# Patient Record
Sex: Male | Born: 1985 | Race: Black or African American | Hispanic: No | Marital: Single | Smoking: Never smoker
Health system: Southern US, Community
[De-identification: ages and names within clinical notes are randomized; demographics above are authoritative.]

## PROBLEM LIST (undated history)

## (undated) DIAGNOSIS — I10 Essential (primary) hypertension: Secondary | ICD-10-CM

## (undated) DIAGNOSIS — G935 Compression of brain: Secondary | ICD-10-CM

## (undated) DIAGNOSIS — K219 Gastro-esophageal reflux disease without esophagitis: Secondary | ICD-10-CM

## (undated) DIAGNOSIS — Z87442 Personal history of urinary calculi: Secondary | ICD-10-CM

## (undated) HISTORY — PX: NO PAST SURGERIES: SHX2092

---

## 2015-04-20 ENCOUNTER — Emergency Department: Payer: Self-pay

## 2015-04-20 ENCOUNTER — Encounter: Payer: Self-pay | Admitting: Emergency Medicine

## 2015-04-20 ENCOUNTER — Emergency Department
Admission: EM | Admit: 2015-04-20 | Discharge: 2015-04-20 | Disposition: A | Discharge status: Home / Self Care | Payer: Self-pay | Attending: Emergency Medicine | Admitting: Emergency Medicine

## 2015-04-20 DIAGNOSIS — X58XXXA Exposure to other specified factors, initial encounter: Secondary | ICD-10-CM | POA: Insufficient documentation

## 2015-04-20 DIAGNOSIS — Y990 Civilian activity done for income or pay: Secondary | ICD-10-CM | POA: Insufficient documentation

## 2015-04-20 DIAGNOSIS — Y9389 Activity, other specified: Secondary | ICD-10-CM | POA: Insufficient documentation

## 2015-04-20 DIAGNOSIS — S46912A Strain of unspecified muscle, fascia and tendon at shoulder and upper arm level, left arm, initial encounter: Secondary | ICD-10-CM | POA: Insufficient documentation

## 2015-04-20 DIAGNOSIS — Y9289 Other specified places as the place of occurrence of the external cause: Secondary | ICD-10-CM | POA: Insufficient documentation

## 2015-04-20 IMAGING — CR DG SHOULDER 2+V*L*
3 series · 3 of 3 positions shown · non-contrast
Comparison: None.

CLINICAL DATA: Patient began having posterior left shoulder pain
this morning after leaving work. Patient states that he has had left
arm pain and numbness for 3-4 years with no injury.

EXAM:
LEFT SHOULDER - 2+ VIEW

[shoulder grashey]
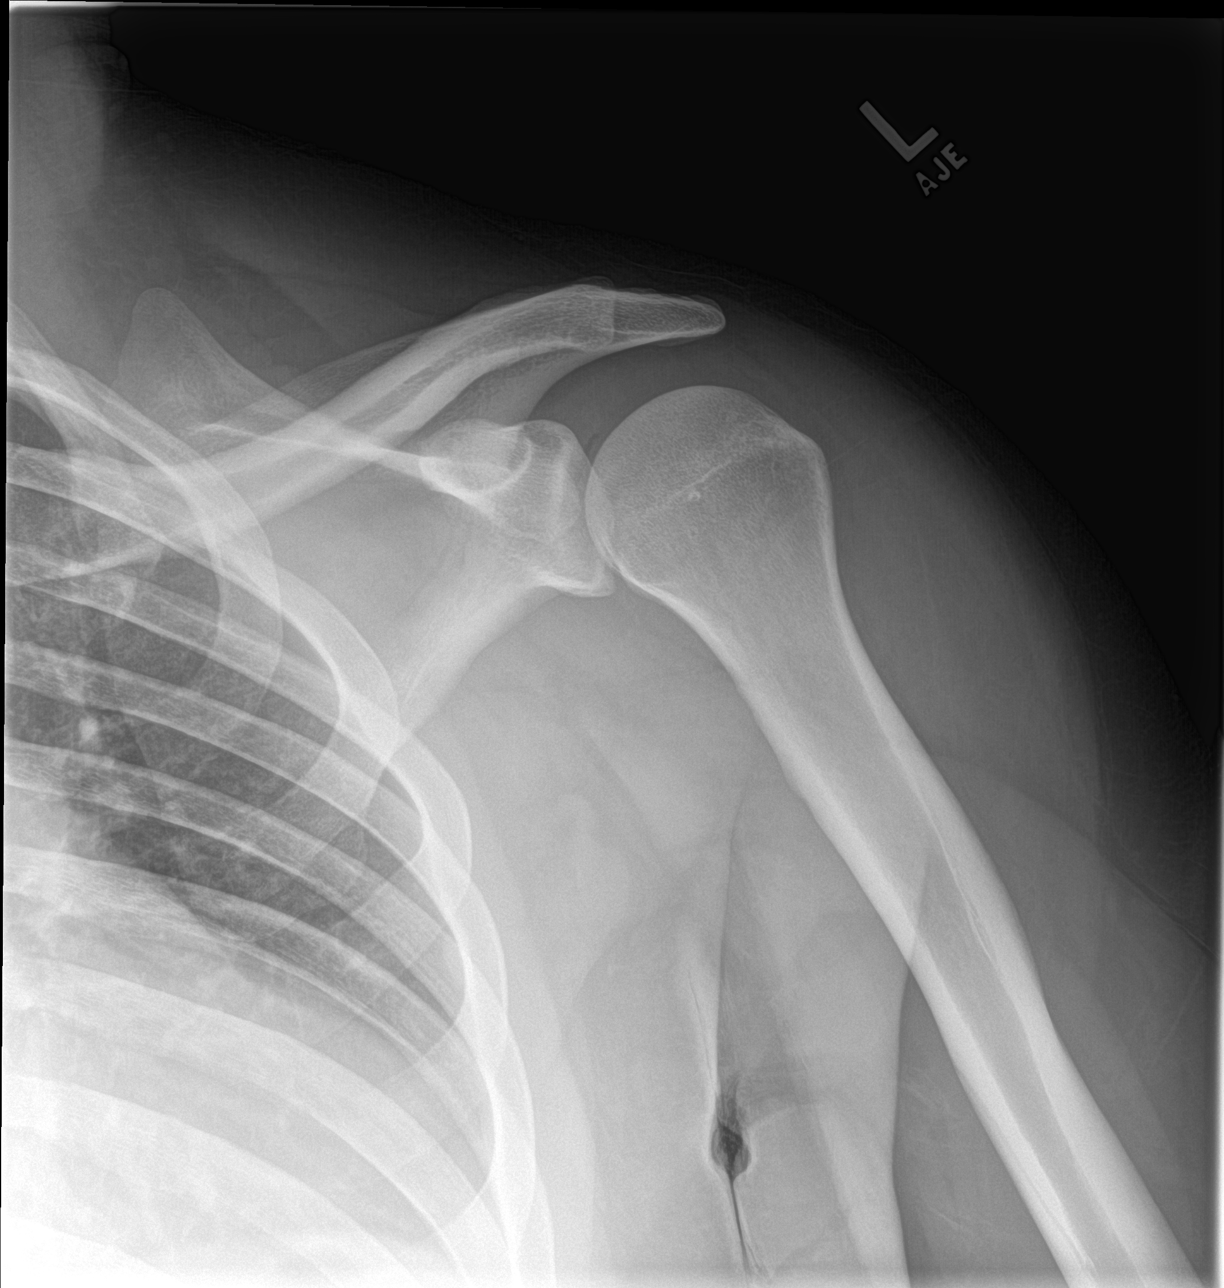

[shoulder y view]
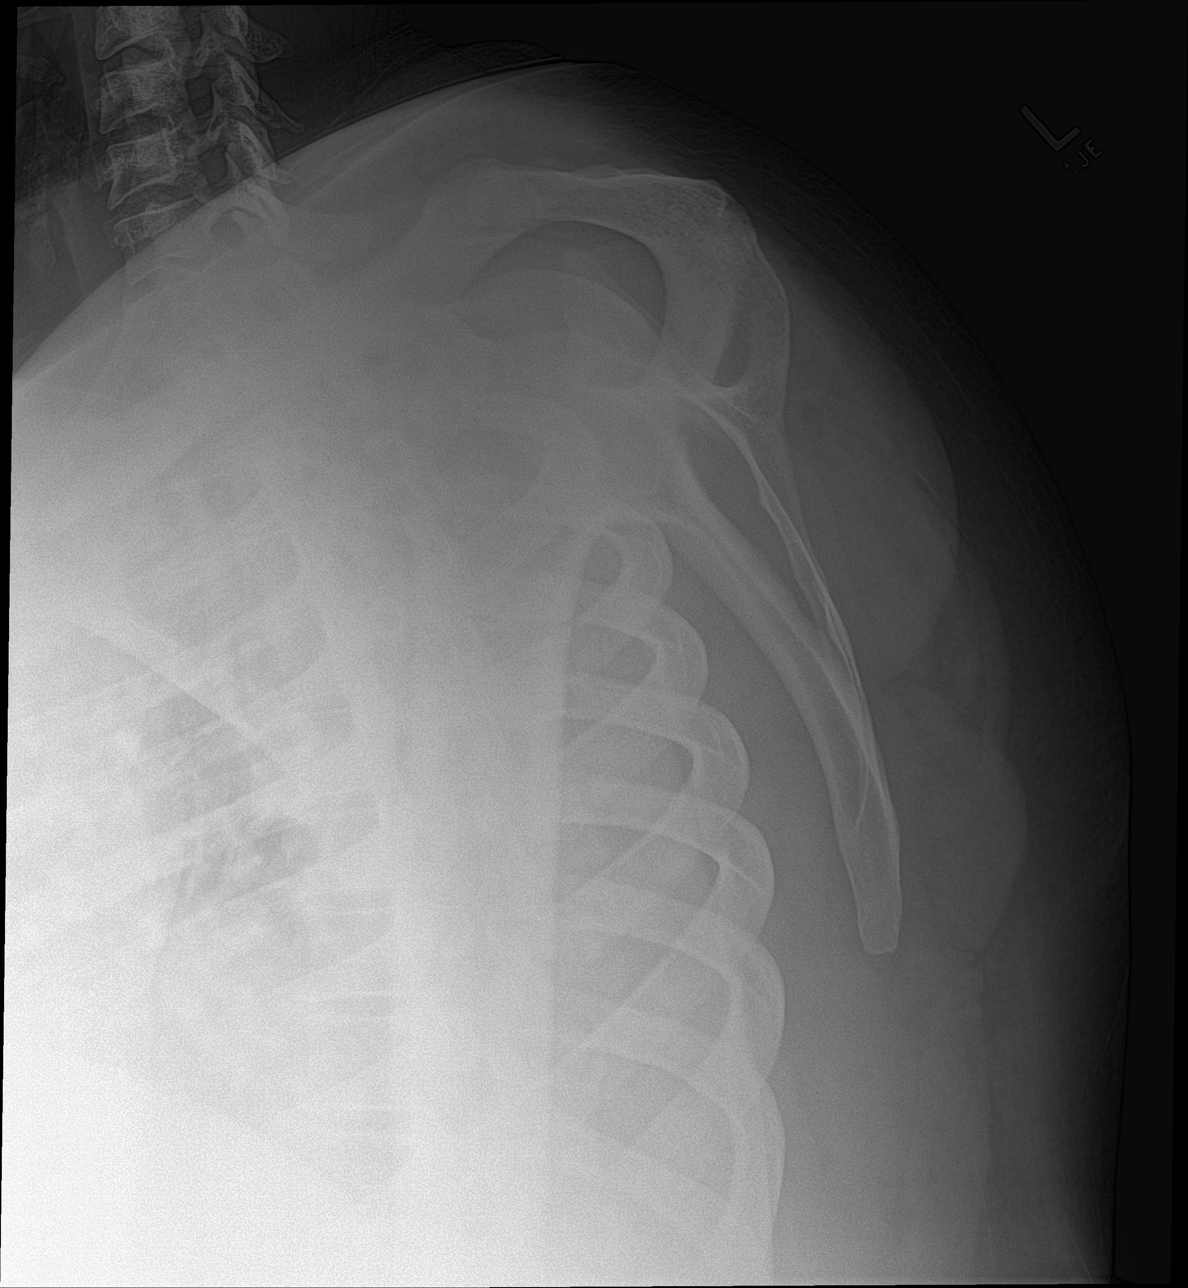

[shoulder axillary]
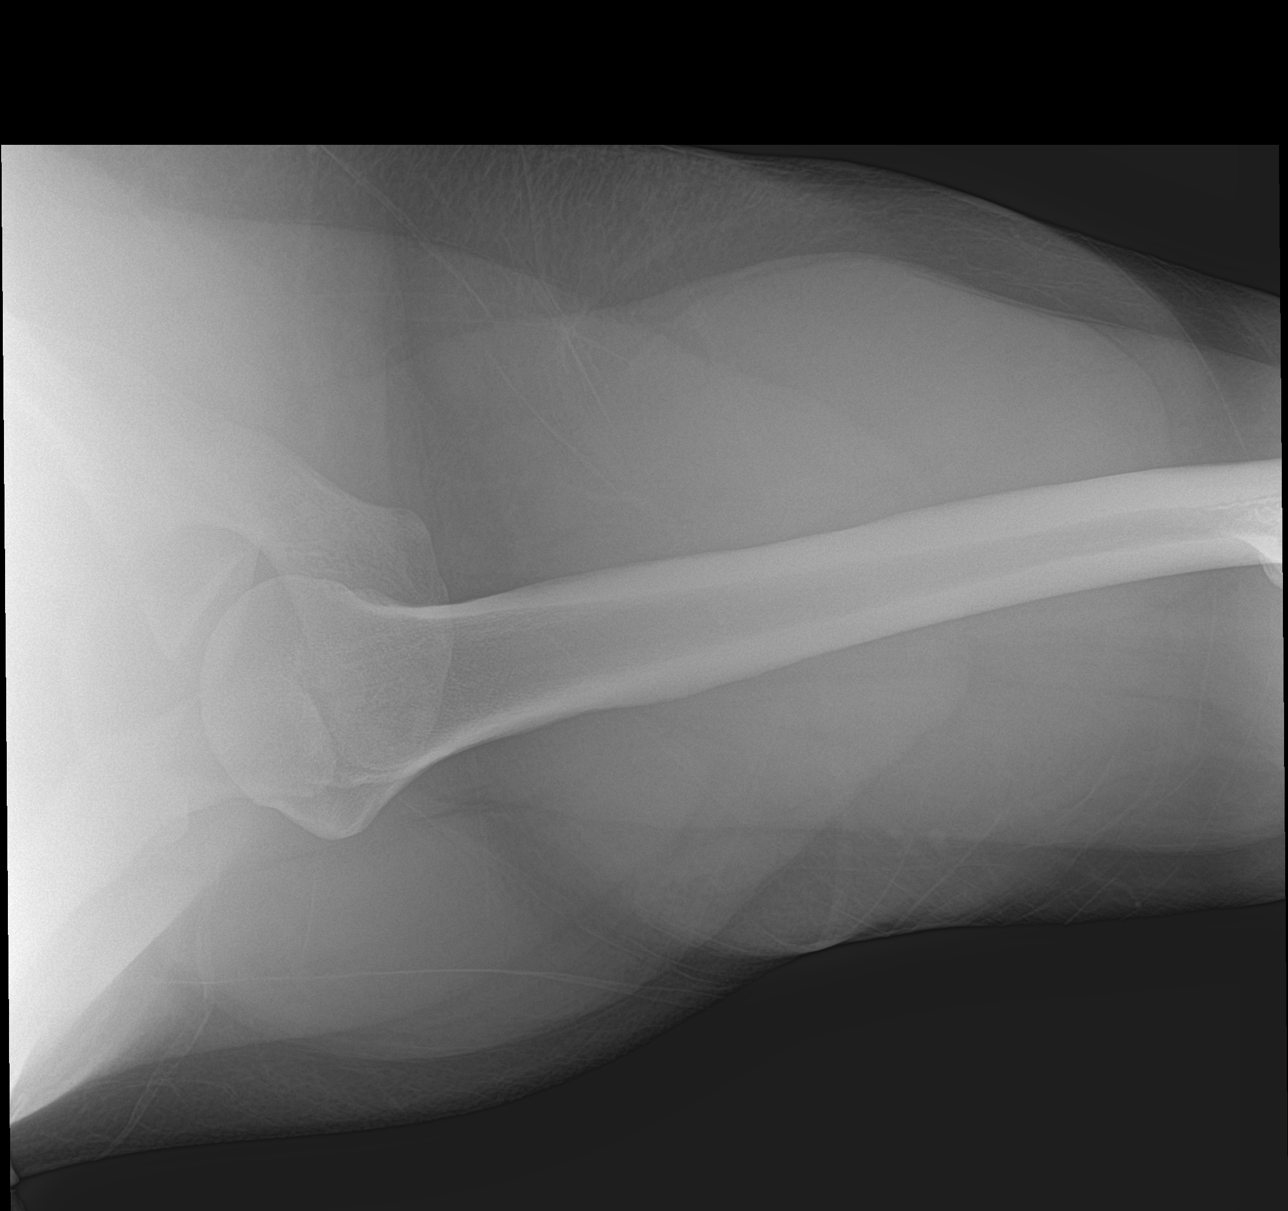

[3 of 3 positions shown; findings below may reference images not displayed]

FINDINGS: There is no evidence of fracture or dislocation. There is no
evidence of arthropathy or other focal bone abnormality. Soft
tissues are unremarkable.
IMPRESSION: Negative.

## 2015-04-20 MED ORDER — NAPROXEN 500 MG PO TABS: 500.0000 mg | ORAL_TABLET | Freq: Two times a day (BID) | ORAL | Status: DC

## 2015-04-20 MED ORDER — CYCLOBENZAPRINE HCL 10 MG PO TABS: 10.0000 mg | ORAL_TABLET | Freq: Every day | ORAL | Status: DC

## 2015-04-20 NOTE — ED Notes (Signed)
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

## 2015-04-20 NOTE — ED Notes (Signed)
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 Woke up with pain to left shoulder no deformity noted

## 2015-04-20 NOTE — ED Provider Notes (Signed)
CSN: [id number]     Arrival date & time 04/20/15  1314 History   First MD Initiated Contact with Patient 04/20/15 1340     Chief Complaint  Patient presents with  . Shoulder Pain    HPI Comments: 29 year old male presents today complaining of left shoulder pain that started this morning. 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 He does not remember injuring the shoulder. No history of prior injury in the past. He does report he thinks there is decreased sensation in his shoulder. Has not taken anything for the pain. All range of motion is painful.  Patient is a 29 y.o. male presenting with shoulder pain.  Shoulder Pain Associated symptoms: no neck pain     History reviewed. No pertinent past medical history. History reviewed. No pertinent past surgical history. No family history on file. Social History  Substance Use Topics  . Smoking status: Never Smoker   . Smokeless tobacco: None  . Alcohol Use: No    Review of Systems  Musculoskeletal: Positive for myalgias and arthralgias. Negative for joint swelling, neck pain and neck stiffness.  Neurological: Positive for numbness. Negative for weakness.  All other systems reviewed and are negative.     Allergies  Review of patient's allergies indicates no known allergies.  Home Medications   Prior to Admission medications   Medication Sig Start Date End Date Taking? Authorizing Provider  cyclobenzaprine (FLEXERIL) 10 MG tablet Take 1 tablet (10 mg total) by mouth at bedtime. 04/20/15   Luvenia Redden, PA-C  naproxen (NAPROSYN) 500 MG tablet Take 1 tablet (500 mg total) by mouth 2 (two) times daily with a meal. 04/20/15   Wilber Oliphant V, PA-C   BP 136/84 mmHg  Pulse 81  Temp(Src) 97.8 F (36.6 C) (Oral)  Resp 20  Ht  (1.702 m)  Wt 280 lb (127.007 kg)  BMI 43.84 kg/m2  SpO2 99% Physical Exam  Constitutional: He is oriented to person, place, and time. Vital signs are  normal. He appears well-developed and well-nourished. He is active.  Non-toxic appearance. He does not have a sickly appearance. He does not appear ill.  Morbidly obese  HENT:  Head: Normocephalic and atraumatic.  Neck: Normal range of motion. Neck supple.  Musculoskeletal: He exhibits tenderness.       Left shoulder: He exhibits decreased range of motion and tenderness. He exhibits no swelling, no crepitus, no deformity, no spasm, normal pulse and normal strength.       Arms: Neurological: He is alert and oriented to person, place, and time.  No decreased sensation left shoulder  Skin: Skin is warm and dry.  Psychiatric: He has a normal mood and affect. His behavior is normal. Judgment and thought content normal.  Nursing note and vitals reviewed.   ED Course  Procedures (including critical care time) Labs Review Labs Reviewed - No data to display  Imaging Review Dg Shoulder Left  04/20/2015   CLINICAL DATA:  Patient began having posterior left shoulder pain this morning after leaving work. Patient states that he has had left arm pain and numbness for 3-4 years with no injury.  EXAM: LEFT SHOULDER - 2+ VIEW  COMPARISON:  None.  FINDINGS: There is no evidence of fracture or dislocation. There is no evidence of arthropathy or other focal bone abnormality. Soft tissues are unremarkable.  IMPRESSION: Negative.   Electronically Signed   By: Norva Pavlov M.D.  On: 04/20/2015 14:23   I have personally reviewed and evaluated these images and lab results as part of my medical decision-making.   EKG Interpretation None      MDM  Normal xray, likely muscular strain. Naproxen BID with food, flexeril qhs - drowsiness warning given. Advised to do ROM exercises to keep shoulder loose. Follow up with ortho if no improvement.  Final diagnoses:  Left shoulder strain, initial encounter        Luvenia Redden, PA-C 04/20/15 1431  Myrna Blazer, MD 04/20/15 [phone]

## 2017-10-01 ENCOUNTER — Encounter: Payer: Self-pay | Admitting: Emergency Medicine

## 2017-10-01 ENCOUNTER — Other Ambulatory Visit: Payer: Self-pay

## 2017-10-01 ENCOUNTER — Emergency Department
Admission: EM | Admit: 2017-10-01 | Discharge: 2017-10-01 | Disposition: A | Discharge status: Home / Self Care | Payer: Self-pay | Attending: Emergency Medicine | Admitting: Emergency Medicine

## 2017-10-01 DIAGNOSIS — M5431 Sciatica, right side: Secondary | ICD-10-CM | POA: Insufficient documentation

## 2017-10-01 MED ORDER — PREDNISONE 10 MG PO TABS: ORAL_TABLET | ORAL | 0 refills | Status: DC

## 2017-10-01 NOTE — ED Triage Notes (Signed)
R leg pain running down back of leg. States history of low back pain radiating to same leg. Denies fall or injury.

## 2017-10-01 NOTE — ED Notes (Signed)
See triage note  Presents with pain to posterior right knee which is  Moving up into hip area  Denies any fall or known injury

## 2017-10-01 NOTE — Discharge Instructions (Addendum)
Call [facility] health at [facility] for an appointment to establish a primary care provider and also to recheck your blood pressure.  Begin taking prednisone as directed beginning with 6 tablets today and tapering down 1 tablet each day until finished.  You may also take Tylenol with this medication.

## 2017-10-01 NOTE — ED Provider Notes (Signed)
[facility] Emergency Department Provider Note  ____________________________________________   First MD Initiated Contact with Patient 10/01/17 0932     (approximate)  I have reviewed the triage vital signs and the nursing notes.   HISTORY  Chief Complaint Leg Pain   HPI Sean Oneill is a 32 y.o. male 0 complaint of right leg pain radiating from his right buttocks down to his knee.  Patient states that he has a history of low back issues secondary to the type of work that he does.  Patient denies any injury recently.  He took ibuprofen at approximately 4 AM.  He denies any urinary symptoms, bowel or bladder incontinence.  Patient continues to ambulate without any difficulty.  Currently his blood pressure is elevated and he believes that someone in his family has a history of hypertension.  He has not had his blood pressure checked in the past because he does not have a PCP.  He rates his pain as an 8 out of 10.  History reviewed. No pertinent past medical history.  There are no active problems to display for this patient.   History reviewed. No pertinent surgical history.  Prior to Admission medications   Medication Sig Start Date End Date Taking? Authorizing Provider  predniSONE (DELTASONE) 10 MG tablet Take 6 tablets  today, on day 2 take 5 tablets, day 3 take 4 tablets, day 4 take 3 tablets, day 5 take  2 tablets and 1 tablet the last day 10/01/17   Tommi Rumps, PA-C    Allergies Patient has no known allergies.  No family history on file.  Social History Social History   Tobacco Use  . Smoking status: Never Smoker  Substance Use Topics  . Alcohol use: No  . Drug use: No    Review of Systems Constitutional: No fever/chills Cardiovascular: Denies chest pain. Respiratory: Denies shortness of breath. Gastrointestinal: No abdominal pain.  No nausea, no vomiting.  Genitourinary: Negative for dysuria. Musculoskeletal: Positive right leg  pain with radiculopathy. Skin: Negative for rash. Neurological: Negative for headaches, focal weakness or numbness. ___________________________________________   PHYSICAL EXAM:  VITAL SIGNS: ED Triage Vitals  Enc Vitals Group     BP 10/01/17 0859 (!) 170/114     Pulse Rate 10/01/17 0859 73     Resp 10/01/17 0859 20     Temp 10/01/17 0859 98.9 F (37.2 C)     Temp Source 10/01/17 0859 Oral     SpO2 10/01/17 0859 100 %     Weight 10/01/17 0902 260 lb (117.9 kg)     Height 10/01/17 0902 5\' 8"  (1.727 m)     Head Circumference --      Peak Flow --      Pain Score 10/01/17 0915 8     Pain Loc --      Pain Edu? --      Excl. in GC? --    Constitutional: Alert and oriented. Well appearing and in no acute distress. Eyes: Conjunctivae are normal.  Head: Atraumatic. Neck: No stridor.  Cardiovascular: Normal rate, regular rhythm. Grossly normal heart sounds.  Good peripheral circulation. Respiratory: Normal respiratory effort.  No retractions. Lungs CTAB. Gastrointestinal: Soft and nontender. No distention.  No CVA tenderness. Musculoskeletal: Examination of the back there is no gross deformity noted.  There is moderate tenderness on palpation of the right SI joint area.  Range of motion is without restriction.  Good muscle strength bilaterally.  Normal gait was noted.  Straight  leg raises were negative. Neurologic:  Normal speech and language. No gross focal neurologic deficits are appreciated.  Reflexes 2+ bilaterally.  No gait instability. Skin:  Skin is warm, dry and intact. No rash noted. Psychiatric: Mood and affect are normal. Speech and behavior are normal.  ____________________________________________   LABS (all labs ordered are listed, but only abnormal results are displayed)  Labs Reviewed - No data to display   PROCEDURES  Procedure(s) performed: None  Procedures  Critical Care performed: No  ____________________________________________   INITIAL IMPRESSION  / ASSESSMENT AND PLAN / ED COURSE  As part of my medical decision making, I reviewed the following data within the electronic MEDICAL RECORD NUMBER Notes from prior ED visits and [place] Controlled Substance Database  Patient was given prescription for prednisone 60 mg to taper by 1 tablet each day for the next 6 days.  He may also take Tylenol if needed for added pain relief.  Patient was made aware that his blood pressure was elevated when he came to triage.  He plans to follow-up with the practice that his wife currently goes to.  He will call make appointment with [facility] at [facility] for recheck of his blood pressure.  Blood pressure was improved prior to discharge.  ____________________________________________   FINAL CLINICAL IMPRESSION(S) / ED DIAGNOSES  Final diagnoses:  Sciatica of right side     ED Discharge Orders        Ordered    predniSONE (DELTASONE) 10 MG tablet     10/01/17 1009       Note:  This document was prepared using Dragon voice recognition software and may include unintentional dictation errors.    Tommi Rumps, PA-C 10/01/17 1250    Jeanmarie Plant, MD 10/01/17 [phone]

## 2019-12-18 ENCOUNTER — Other Ambulatory Visit: Payer: Self-pay | Admitting: Acute Care

## 2019-12-18 DIAGNOSIS — G35 Multiple sclerosis: Secondary | ICD-10-CM

## 2020-01-22 ENCOUNTER — Ambulatory Visit
Admission: RE | Admit: 2020-01-22 | Discharge: 2020-01-22 | Disposition: A | Discharge status: Home / Self Care | Payer: BC Managed Care – PPO | Source: Ambulatory Visit | Attending: Acute Care | Admitting: Acute Care

## 2020-01-22 DIAGNOSIS — G35 Multiple sclerosis: Secondary | ICD-10-CM

## 2020-01-22 IMAGING — MR MR HEAD WO/W CM
13 series · 48 of 48 positions shown · IV contrast (multihance)
Comparison: None.

CLINICAL DATA: Numbness

EXAM:
MRI HEAD WITHOUT AND WITH CONTRAST
TECHNIQUE: Multiplanar, multiecho pulse sequences of the brain and surrounding
structures were obtained without and with intravenous contrast.
CONTRAST:  20mL MULTIHANCE GADOBENATE DIMEGLUMINE 529 MG/ML IV SOLN

[Series 5: T1 · sagittal · 4.0mm · 0.75mm/px · 2 of 31 slices shown (1 of 3)]
[im 1/31]
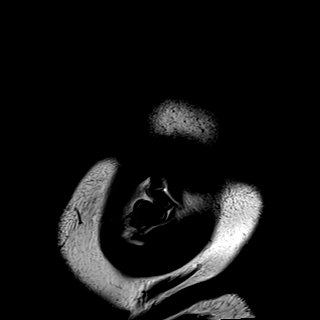
[im 31/31]
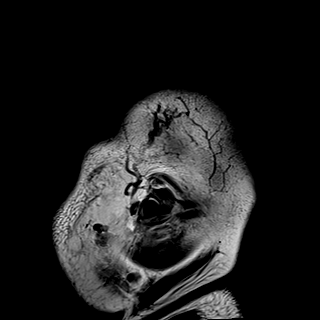

[Series 6: DWI · axial · 3.0mm · 1.44mm/px · z∈[-34,+120]mm · 5 of 96 slices shown (1 of 4)]
[im 1/96]
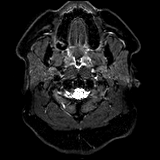
[im 24/96]
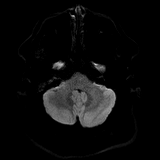
[im 48/96]
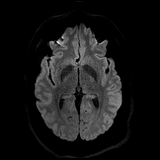
[im 72/96]
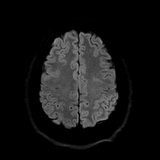
[im 96/96]
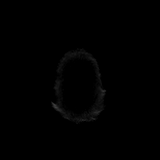

[Series 7: DWI · axial · 3.0mm · 1.44mm/px · z∈[-34,+120]mm · 3 of 48 slices shown (2 of 4)]
[im 1/48]
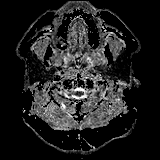
[im 24/48]
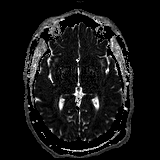
[im 48/48]
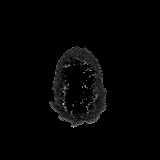

[Series 8: DWI · coronal · 5.0mm · 1.44mm/px · 4 of 66 slices shown (3 of 4)]
[im 1/66]
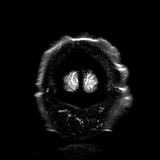
[im 22/66]
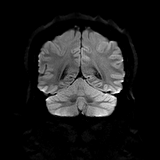
[im 44/66]
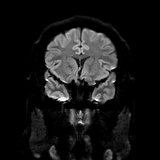
[im 66/66]
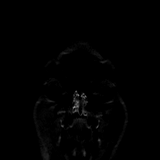

[Series 9: DWI · coronal · 5.0mm · 1.44mm/px · 2 of 33 slices shown (4 of 4)]
[im 1/33]
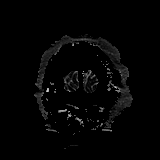
[im 33/33]
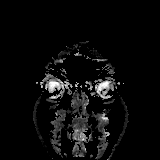

[Series 10: T2 · axial · 4.0mm · 0.36mm/px · z∈[-34,+116]mm · 2 of 30 slices shown]
[im 1/30]
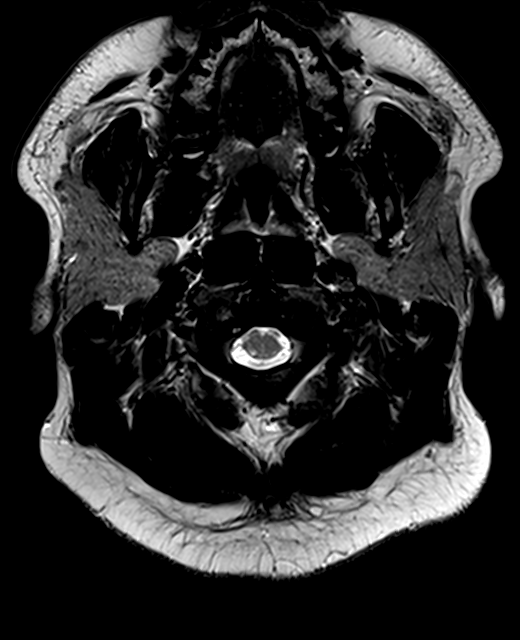
[im 30/30]
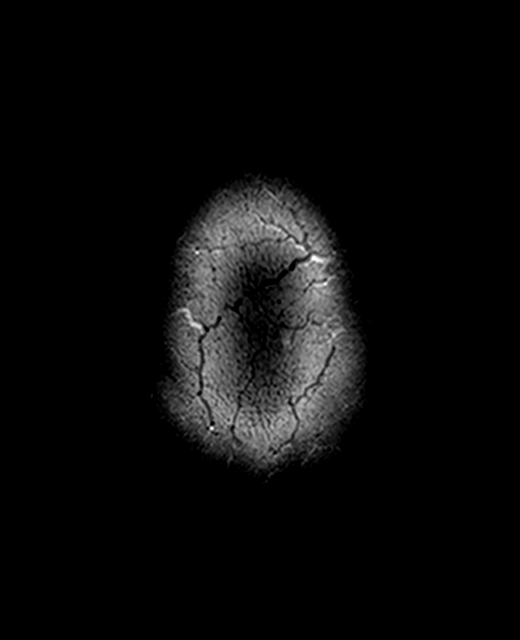

[Series 11: FLAIR · axial · 3.0mm · 0.72mm/px · 1 of 26 slices shown (1 of 2)]
[im 1/26]
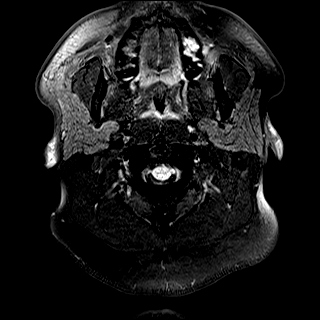

[Series 13: swi_images · axial · 1.5mm · 0.90mm/px · z∈[-30,+112]mm · 5 of 96 slices shown]
[im 1/96]
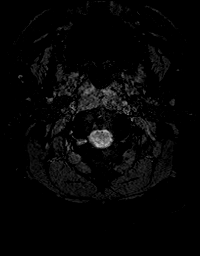
[im 24/96]
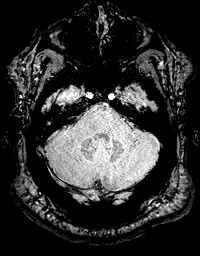
[im 48/96]
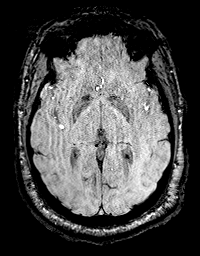
[im 72/96]
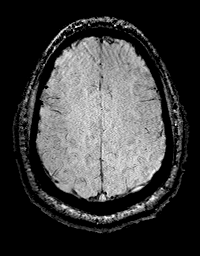
[im 96/96]
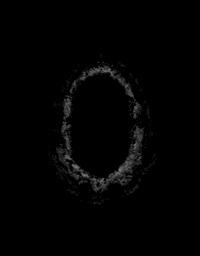

[Series 14: T1 · axial · 1.0mm · 0.94mm/px · z∈[-44,+115]mm · 9 of 160 slices shown (2 of 3)]
[im 1/160]
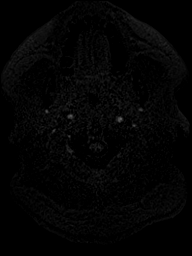
[im 20/160]
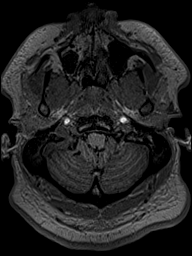
[im 40/160]
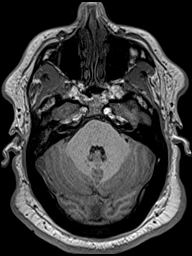
[im 60/160]
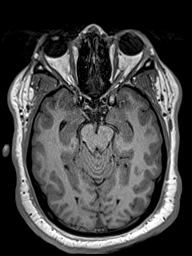
[im 80/160]
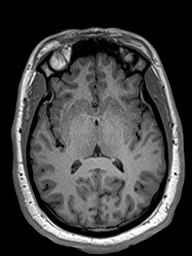
[im 100/160]
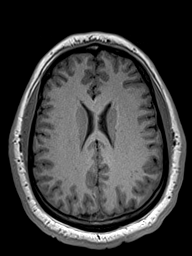
[im 120/160]
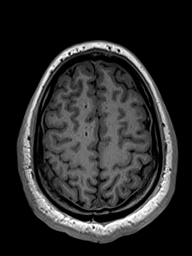
[im 140/160]
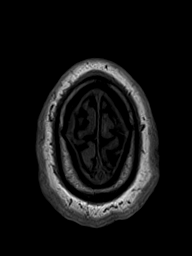
[im 160/160]
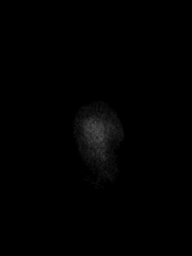

[Series 15: FLAIR · sagittal · 4.0mm · 0.72mm/px · 2 of 27 slices shown (2 of 2)]
[im 1/27]
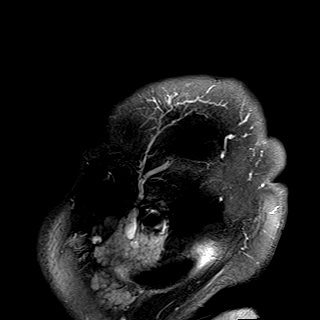
[im 27/27]
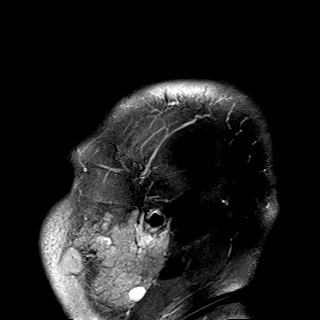

[Series 16: T2 post-contrast · coronal · 4.0mm · 0.36mm/px · 2 of 35 slices shown]
[im 1/35]
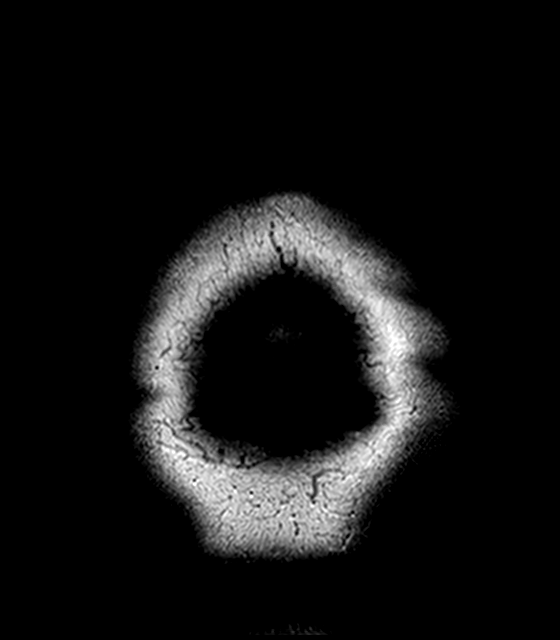
[im 35/35]
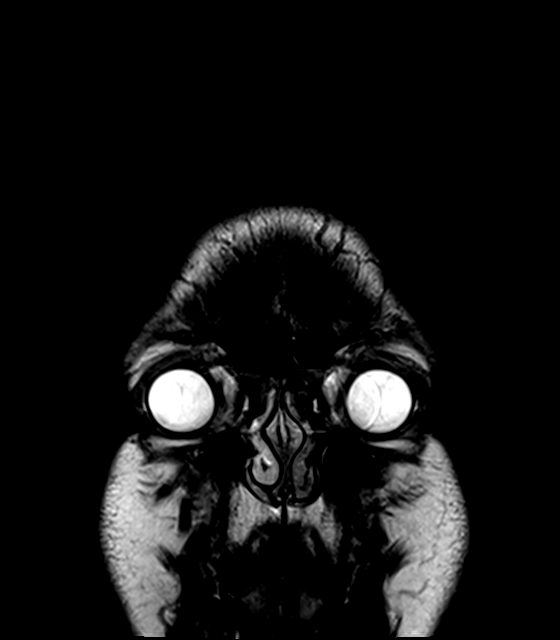

[Series 17: T1 · axial · 1.0mm · 0.94mm/px · z∈[-44,+115]mm · 9 of 160 slices shown (3 of 3)]
[im 1/160]
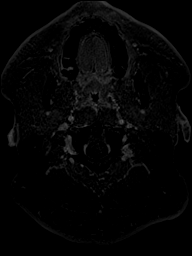
[im 20/160]
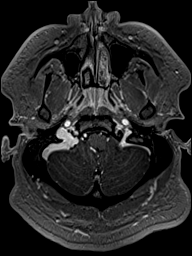
[im 40/160]
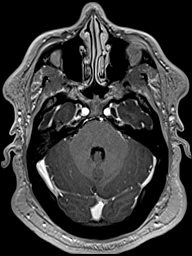
[im 60/160]
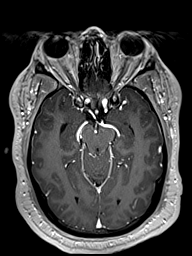
[im 80/160]
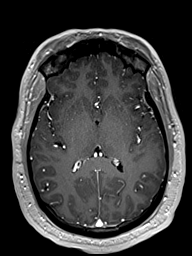
[im 100/160]
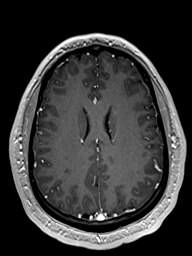
[im 120/160]
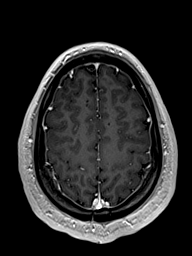
[im 140/160]
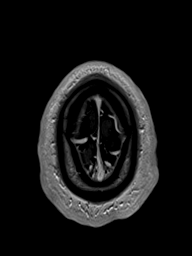
[im 160/160]
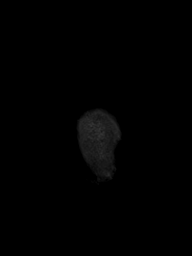

[Series 18: T1 post-contrast · coronal · 4.0mm · 0.72mm/px · 2 of 35 slices shown]
[im 1/35]
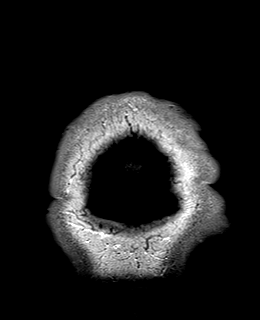
[im 35/35]
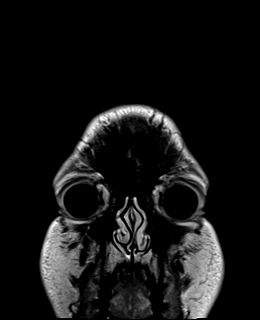

[48 of 48 positions shown; findings below may reference images not displayed]

FINDINGS: Brain: There is no acute infarction or intracranial hemorrhage.
There is no intracranial mass, mass effect, or edema. There is no
hydrocephalus or extra-axial fluid collection. Ventricles and sulci
are normal in size and configuration. Protrusion of cerebellar
tonsils approximately 9 mm below the foramen magnum with a
triangular configuration. No abnormal enhancement.

Vascular: Major vessel flow voids at the skull base are preserved.

Skull and upper cervical spine: Normal marrow signal is preserved.
There is cervical cord T2 hyperintensity with T1 hypointensity
extending inferiorly from the C2 level.

Sinuses/Orbits: Paranasal sinuses are aerated. Orbits are
unremarkable.

Other: Sella is unremarkable.  Mastoid air cells are clear.
IMPRESSION: Chiari 1 malformation with partially imaged cervical cord
syringohydromyelia.

These results will be called to the ordering clinician or
representative by the Radiologist Assistant, and communication
documented in the PACS or [REDACTED].

## 2020-01-22 MED ORDER — GADOBENATE DIMEGLUMINE 529 MG/ML IV SOLN
20.0000 mL | Freq: Once | INTRAVENOUS | Status: AC | PRN
  Administered 2020-01-22: 20 mL via INTRAVENOUS

## 2020-01-28 ENCOUNTER — Other Ambulatory Visit: Payer: Self-pay | Admitting: Acute Care

## 2020-01-28 DIAGNOSIS — Z8669 Personal history of other diseases of the nervous system and sense organs: Secondary | ICD-10-CM

## 2020-02-22 ENCOUNTER — Other Ambulatory Visit: Payer: Self-pay

## 2020-02-22 ENCOUNTER — Ambulatory Visit
Admission: RE | Admit: 2020-02-22 | Discharge: 2020-02-22 | Disposition: A | Discharge status: Home / Self Care | Payer: BC Managed Care – PPO | Source: Ambulatory Visit | Attending: Acute Care | Admitting: Acute Care

## 2020-02-22 DIAGNOSIS — Z8669 Personal history of other diseases of the nervous system and sense organs: Secondary | ICD-10-CM

## 2020-02-22 IMAGING — MR MR CERVICAL SPINE WO/W CM
5 of 8 series · 28 of 48 positions shown · IV contrast (multihance)
Comparison: Brain MRI

CLINICAL DATA: Right-sided numbness and tingling for 1 year in the
arm and leg. History of Chiari malformation.

EXAM:
MRI CERVICAL SPINE WITHOUT AND WITH CONTRAST
TECHNIQUE: Multiplanar and multiecho pulse sequences of the cervical spine, to
include the craniocervical junction and cervicothoracic junction,
were obtained without and with intravenous contrast.
CONTRAST:  20mL MULTIHANCE GADOBENATE DIMEGLUMINE 529 MG/ML IV SOLN

[Series 5: T1 · sagittal · 3.0mm · 0.66mm/px · 3 of 15 slices shown (1 of 3)]
[im 1/15]
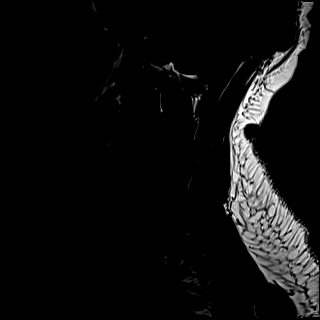
[im 8/15]
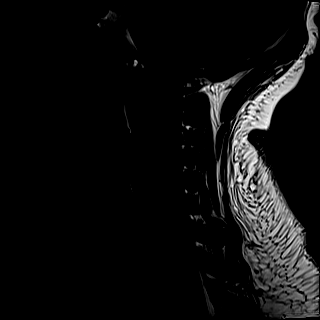
[im 15/15]
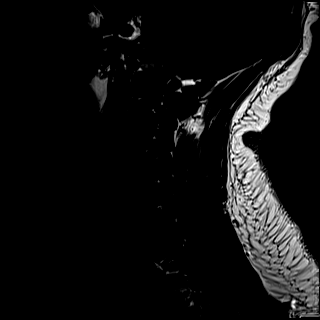

[Series 7: T2 · axial · 3.0mm · 0.62mm/px · z∈[-116,+54]mm · 9 of 54 slices shown (1 of 2)]
[im 1/54]
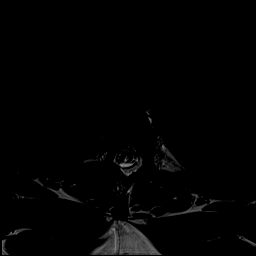
[im 7/54]
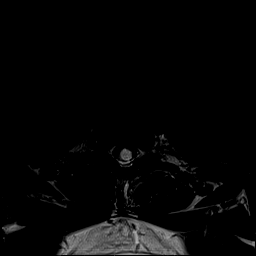
[im 14/54]
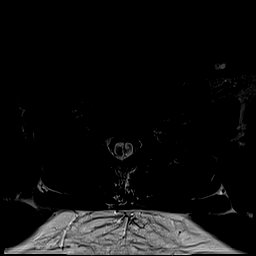
[im 20/54]
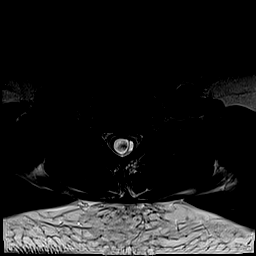
[im 27/54]
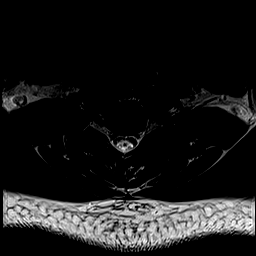
[im 34/54]
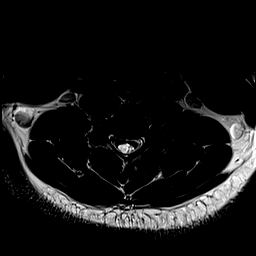
[im 40/54]
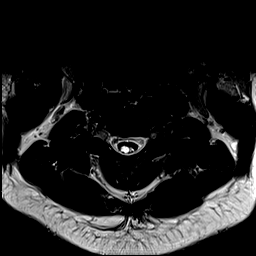
[im 47/54]
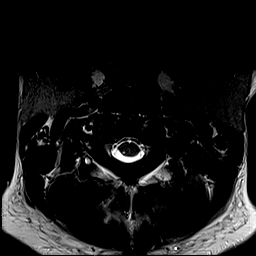
[im 54/54]
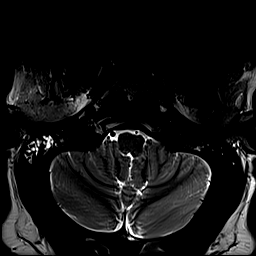

[Series 9: T1 · axial · non-contrast · 3.0mm · 0.31mm/px · z∈[-116,+54]mm · 9 of 54 slices shown (2 of 3)]
[im 1/54]
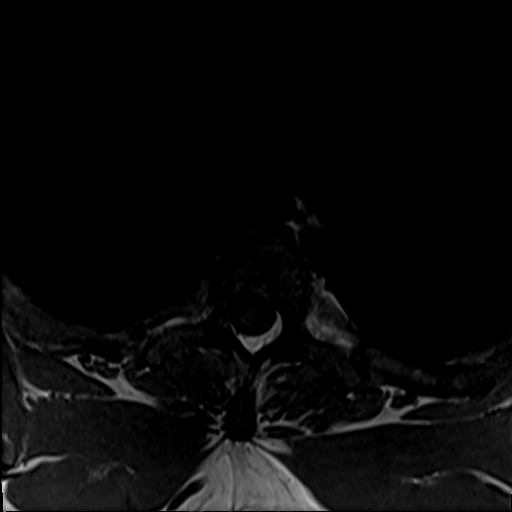
[im 7/54]
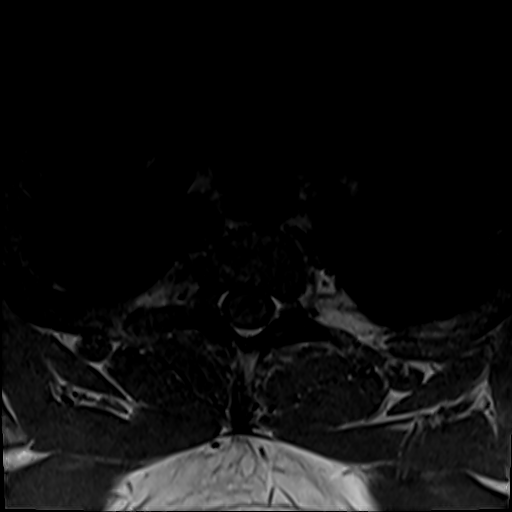
[im 14/54]
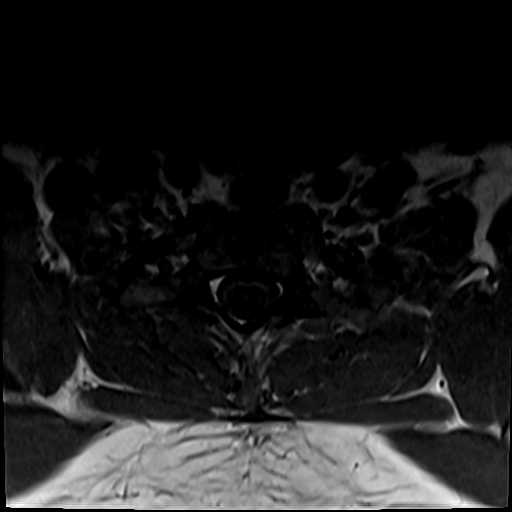
[im 20/54]
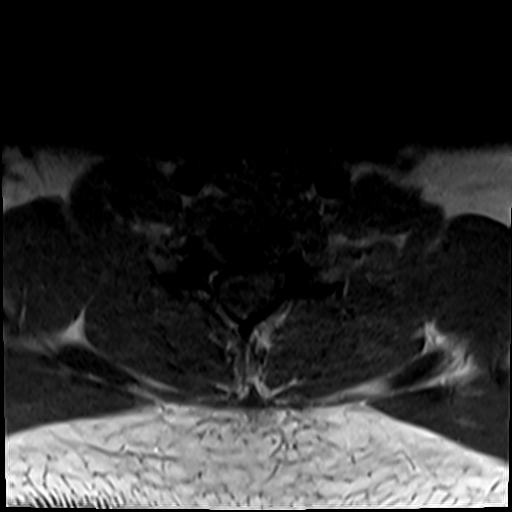
[im 27/54]
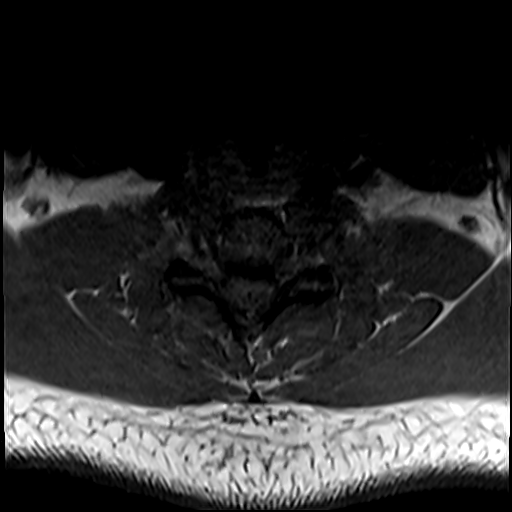
[im 34/54]
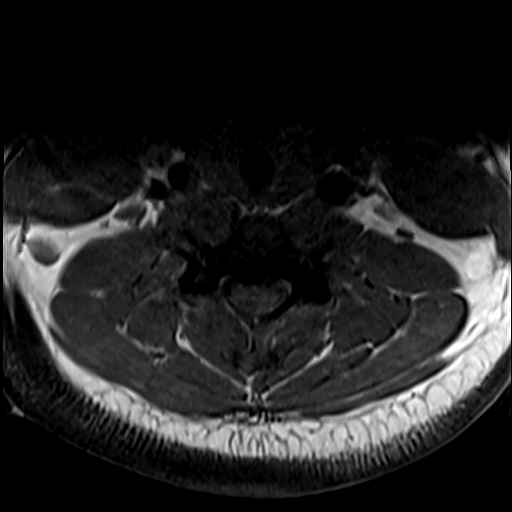
[im 40/54]
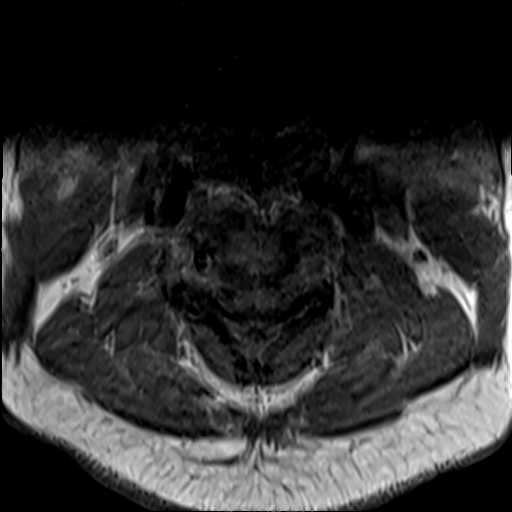
[im 47/54]
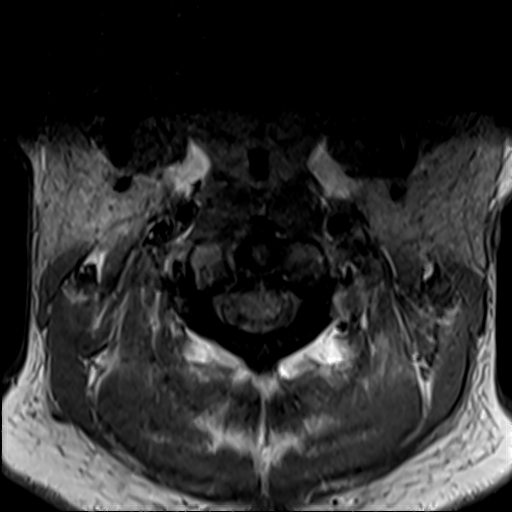
[im 54/54]
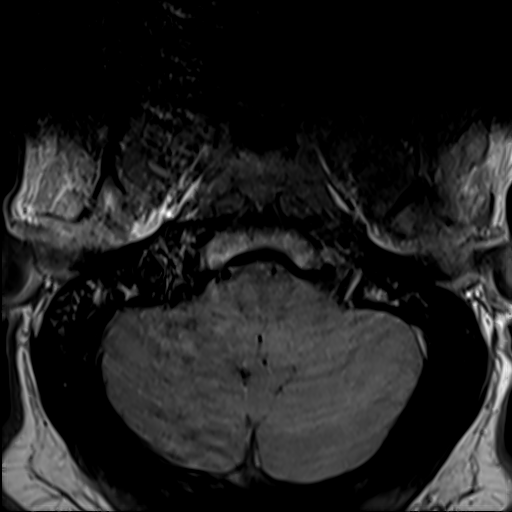

[Series 10: T2 · sagittal · 3.0mm · 0.55mm/px · 3 of 15 slices shown (2 of 2)]
[im 1/15]
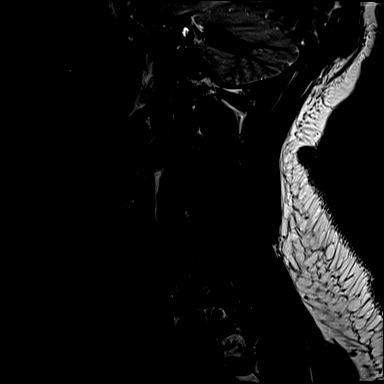
[im 8/15]
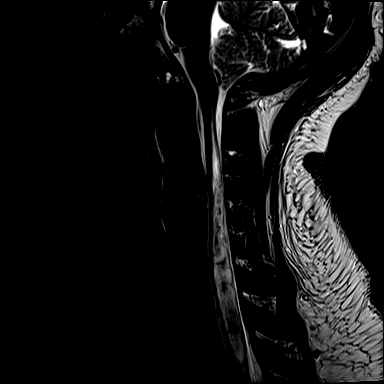
[im 15/15]
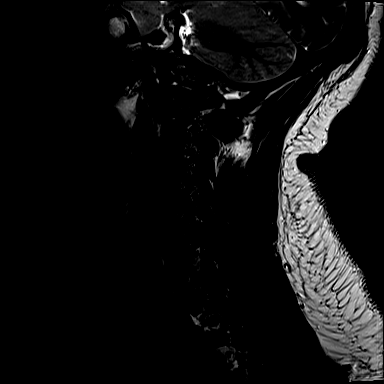

[Series 12: T1 · axial · 3.0mm · 0.31mm/px · z∈[-116,-55]mm · 4 of 54 slices shown (3 of 3)]
[im 1/54]
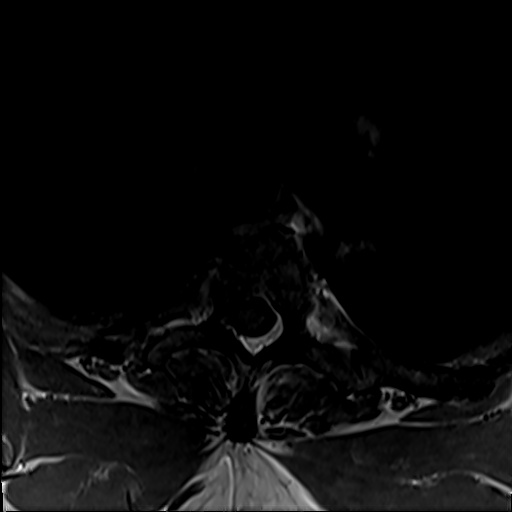
[im 7/54]
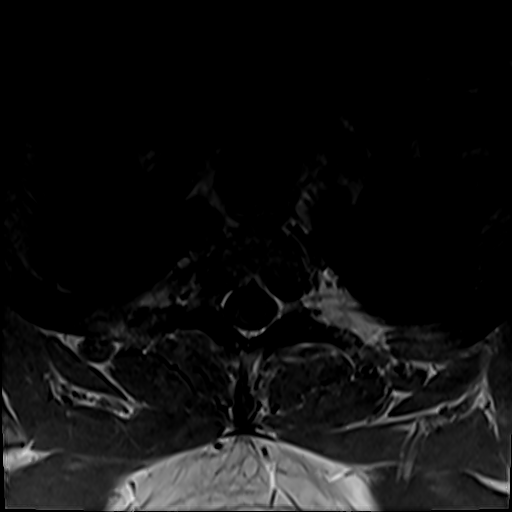
[im 14/54]
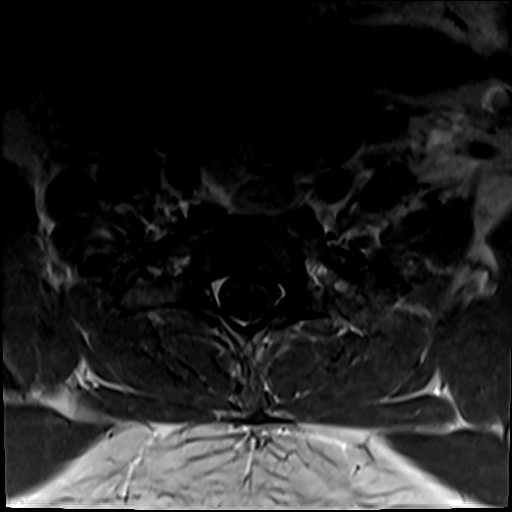
[im 20/54]
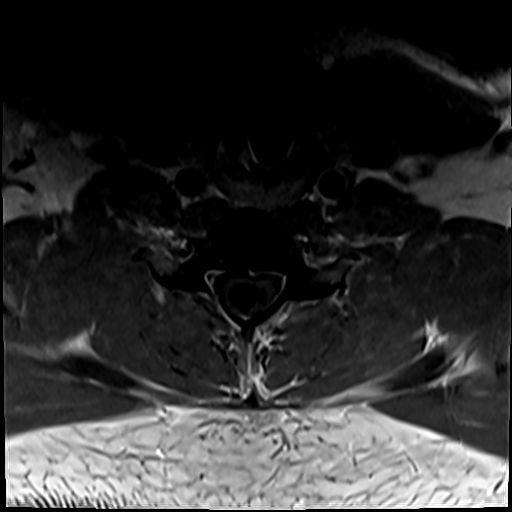

[28 of 48 positions shown; findings below may reference images not displayed]

FINDINGS: Alignment: Normal

Vertebrae: No fracture, evidence of discitis, or bone lesion.

Cord: There is severe hydrosyringomyelia along the imaged length of
the spinal cord with greatest diameter measuring 14 mm at the C7-T1
level. No abnormal contrast enhancement.

Posterior Fossa, vertebral arteries, paraspinal tissues: Chiari type
1 malformation, as previously demonstrated.

Disc levels:

C2-3: Normal.

C3-4: Normal.

C4-5: Small left subarticular disc protrusion with moderate left
foraminal stenosis.

C5-6: Small central disc protrusion without stenosis.

C6-7: Normal.

C7-T1: Normal.

T1-2: Normal.

T2-3: Normal.
IMPRESSION: 1. Severe hydrosyringomyelia along the imaged length of the spinal
cord with greatest diameter measuring 14 mm at the C7-T1 level.
2. Small left subarticular disc protrusion at C4-5 with moderate
left foraminal stenosis.
3. Small central disc protrusion at C5-6 without stenosis.

## 2020-02-22 MED ORDER — GADOBENATE DIMEGLUMINE 529 MG/ML IV SOLN
20.0000 mL | Freq: Once | INTRAVENOUS | Status: AC | PRN
  Administered 2020-02-22: 20 mL via INTRAVENOUS

## 2020-03-04 ENCOUNTER — Other Ambulatory Visit: Payer: Self-pay | Admitting: Neurosurgery

## 2020-03-04 DIAGNOSIS — R5381 Other malaise: Secondary | ICD-10-CM

## 2020-03-05 ENCOUNTER — Other Ambulatory Visit: Payer: Self-pay | Admitting: Neurosurgery

## 2020-03-05 DIAGNOSIS — G950 Syringomyelia and syringobulbia: Secondary | ICD-10-CM

## 2020-03-27 ENCOUNTER — Other Ambulatory Visit: Payer: BC Managed Care – PPO

## 2020-04-17 ENCOUNTER — Other Ambulatory Visit: Payer: BC Managed Care – PPO

## 2020-05-28 ENCOUNTER — Emergency Department: Payer: BC Managed Care – PPO

## 2020-05-28 ENCOUNTER — Encounter: Payer: Self-pay | Admitting: Radiology

## 2020-05-28 ENCOUNTER — Other Ambulatory Visit: Payer: Self-pay

## 2020-05-28 ENCOUNTER — Emergency Department
Admission: EM | Admit: 2020-05-28 | Discharge: 2020-05-28 | Disposition: A | Discharge status: Home / Self Care | Payer: BC Managed Care – PPO | Attending: Emergency Medicine | Admitting: Emergency Medicine

## 2020-05-28 DIAGNOSIS — R1031 Right lower quadrant pain: Secondary | ICD-10-CM

## 2020-05-28 DIAGNOSIS — R109 Unspecified abdominal pain: Secondary | ICD-10-CM | POA: Diagnosis present

## 2020-05-28 DIAGNOSIS — N200 Calculus of kidney: Secondary | ICD-10-CM | POA: Diagnosis not present

## 2020-05-28 DIAGNOSIS — I10 Essential (primary) hypertension: Secondary | ICD-10-CM | POA: Diagnosis not present

## 2020-05-28 LAB — CBC WITH DIFFERENTIAL/PLATELET
Abs Immature Granulocytes: 0.07 10*3/uL (ref 0.00–0.07)
Basophils Absolute: 0 10*3/uL (ref 0.0–0.1)
Basophils Relative: 0 %
Eosinophils Absolute: 0 10*3/uL (ref 0.0–0.5)
Eosinophils Relative: 0 %
HCT: 42.2 % (ref 39.0–52.0)
Hemoglobin: 13.7 g/dL (ref 13.0–17.0)
Immature Granulocytes: 1 %
Lymphocytes Relative: 21 %
Lymphs Abs: 2.5 10*3/uL (ref 0.7–4.0)
MCH: 24.5 pg — ABNORMAL LOW (ref 26.0–34.0)
MCHC: 32.5 g/dL (ref 30.0–36.0)
MCV: 75.5 fL — ABNORMAL LOW (ref 80.0–100.0)
Monocytes Absolute: 1.5 10*3/uL — ABNORMAL HIGH (ref 0.1–1.0)
Monocytes Relative: 12 %
Neutro Abs: 8 10*3/uL — ABNORMAL HIGH (ref 1.7–7.7)
Neutrophils Relative %: 66 %
Platelets: 288 10*3/uL (ref 150–400)
RBC: 5.59 MIL/uL (ref 4.22–5.81)
RDW: 16.4 % — ABNORMAL HIGH (ref 11.5–15.5)
WBC: 12.2 10*3/uL — ABNORMAL HIGH (ref 4.0–10.5)
nRBC: 0 % (ref 0.0–0.2)

## 2020-05-28 LAB — COMPREHENSIVE METABOLIC PANEL
ALT: 27 U/L (ref 0–44)
AST: 25 U/L (ref 15–41)
Albumin: 4.4 g/dL (ref 3.5–5.0)
Alkaline Phosphatase: 43 U/L (ref 38–126)
Anion gap: 11 (ref 5–15)
BUN: 13 mg/dL (ref 6–20)
CO2: 25 mmol/L (ref 22–32)
Calcium: 9.1 mg/dL (ref 8.9–10.3)
Chloride: 104 mmol/L (ref 98–111)
Creatinine, Ser: 1.37 mg/dL — ABNORMAL HIGH (ref 0.61–1.24)
GFR, Estimated: >60 mL/min (ref >60)
Glucose, Bld: 117 mg/dL — ABNORMAL HIGH (ref 70–99)
Potassium: 3.6 mmol/L (ref 3.5–5.1)
Sodium: 140 mmol/L (ref 135–145)
Total Bilirubin: 1.1 mg/dL (ref 0.3–1.2)
Total Protein: 8.2 g/dL — ABNORMAL HIGH (ref 6.5–8.1)

## 2020-05-28 LAB — LIPASE, BLOOD: Lipase: 20 U/L (ref 11–51)

## 2020-05-28 IMAGING — CT CT ABD-PELV W/ CM
2 of 4 series · 16 of 46 positions shown, 18 images · IV contrast (omnipaque)
Comparison: None.

CLINICAL DATA: Right lower quadrant abdominal pain.

EXAM:
CT ABDOMEN AND PELVIS WITH CONTRAST
TECHNIQUE: Multidetector CT imaging of the abdomen and pelvis was performed
using the standard protocol following bolus administration of
intravenous contrast.
CONTRAST:  100mL OMNIPAQUE IOHEXOL 300 MG/ML  SOLN

[Series 2: routine abd/pel with · axial · 0.81mm/px · z∈[-609,-154]mm · 13 of 101 slices shown, 15 images]
[im 5/101  soft-tissue]
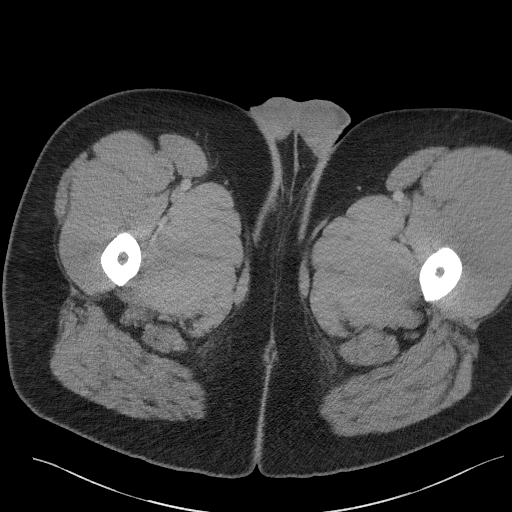
[im 5/101  bone]
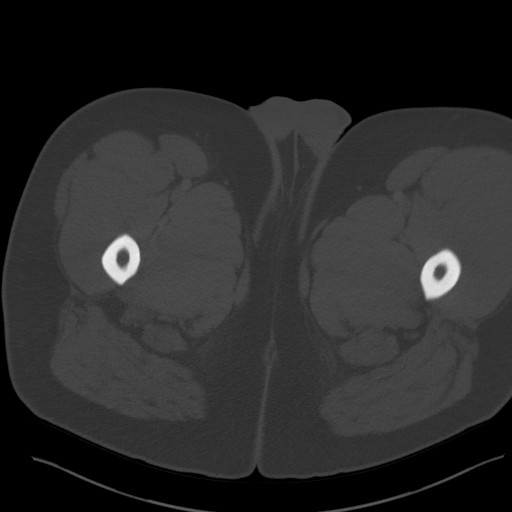
[im 13/101  soft-tissue]
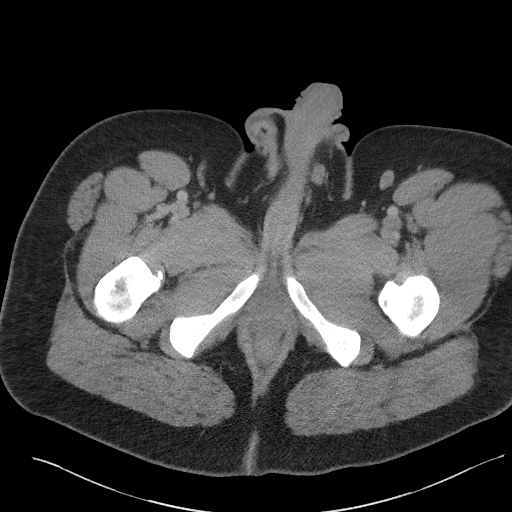
[im 21/101  soft-tissue]
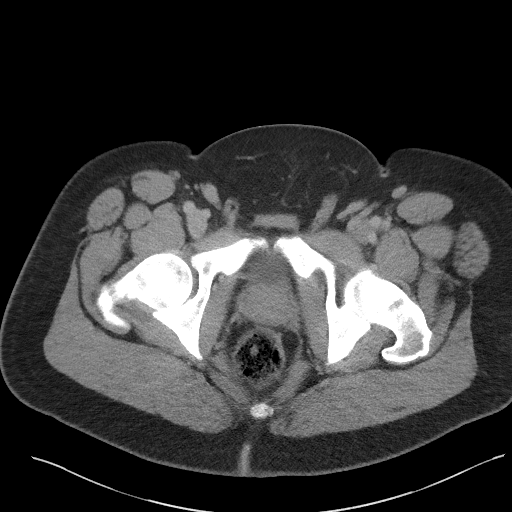
[im 30/101  soft-tissue]
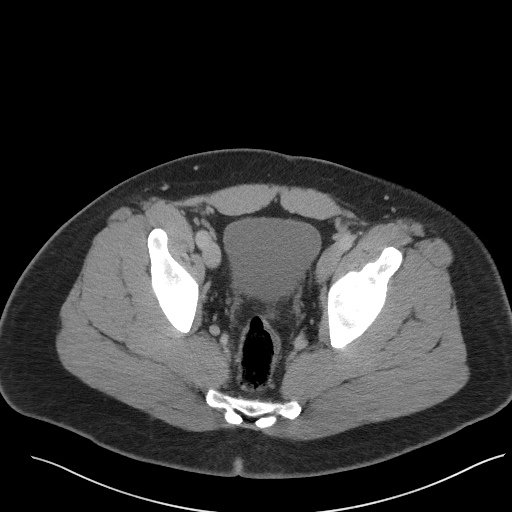
[im 34/101  soft-tissue]
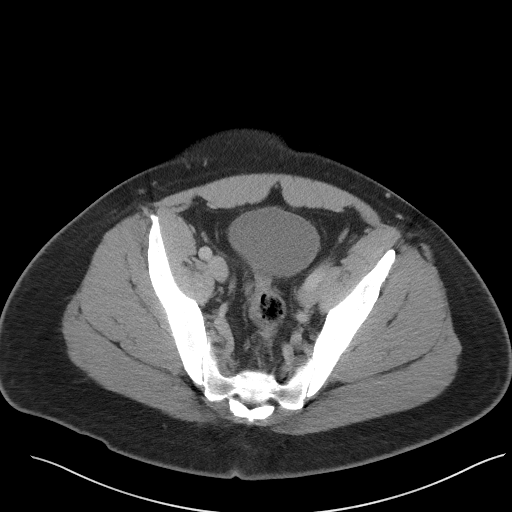
[im 42/101  soft-tissue]
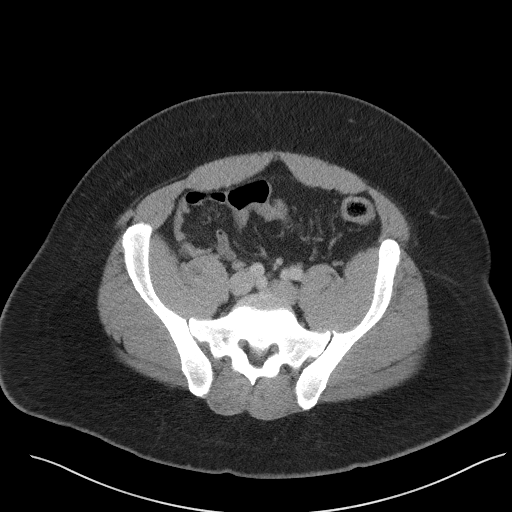
[im 51/101  soft-tissue]
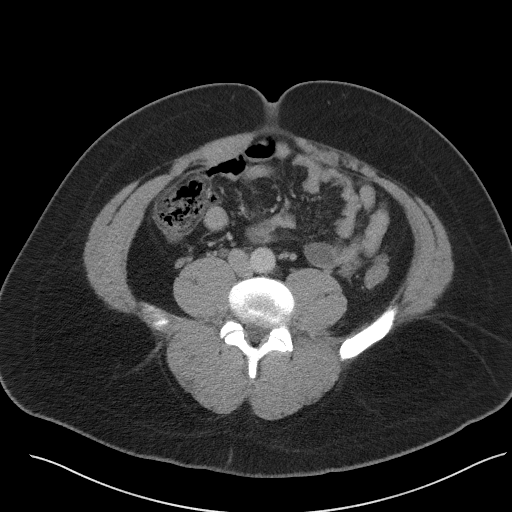
[im 59/101  soft-tissue]
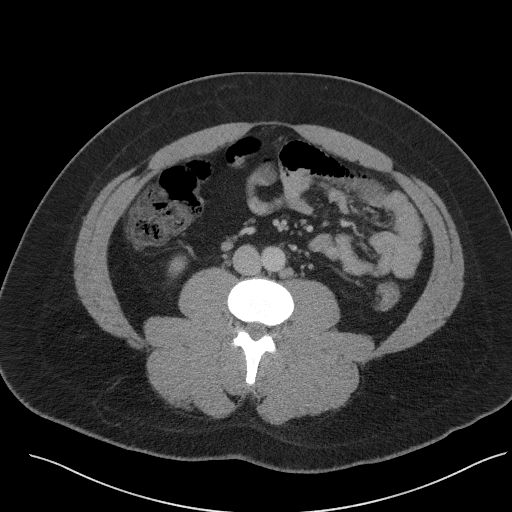
[im 67/101  soft-tissue]
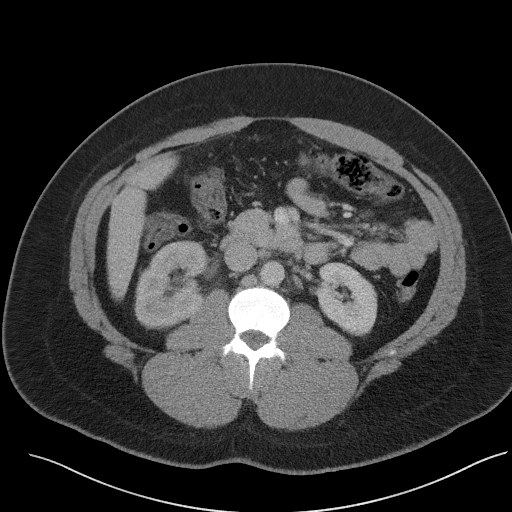
[im 67/101  bone]
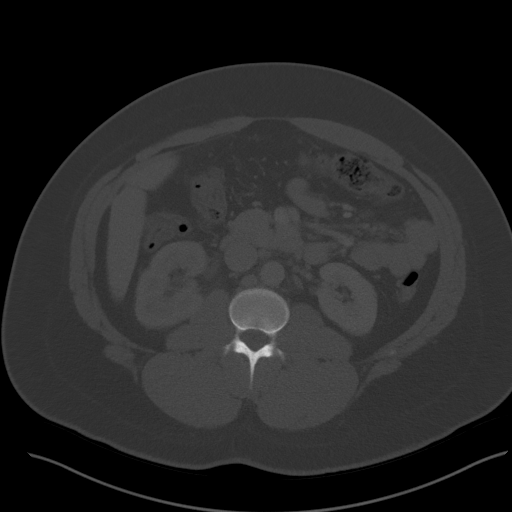
[im 71/101  soft-tissue]
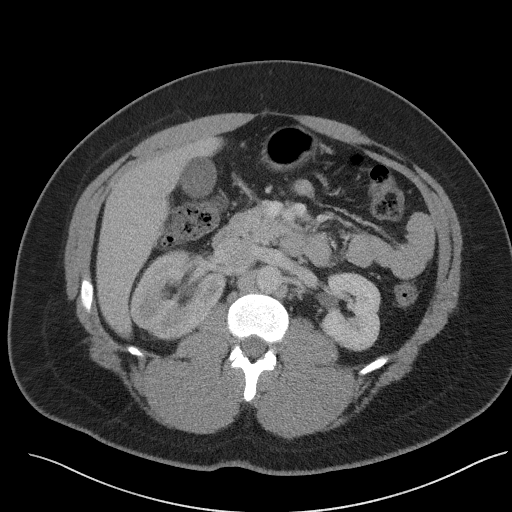
[im 80/101  soft-tissue]
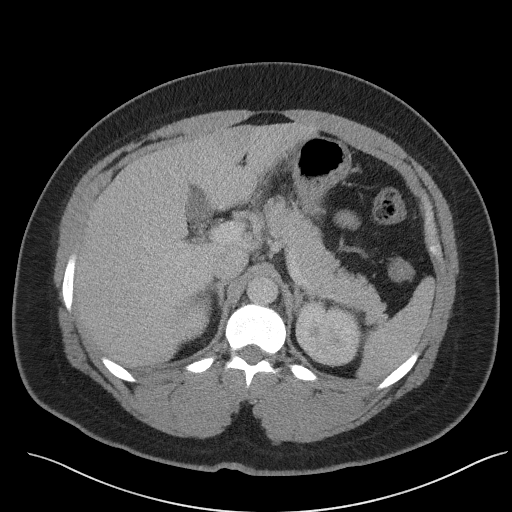
[im 88/101  soft-tissue]
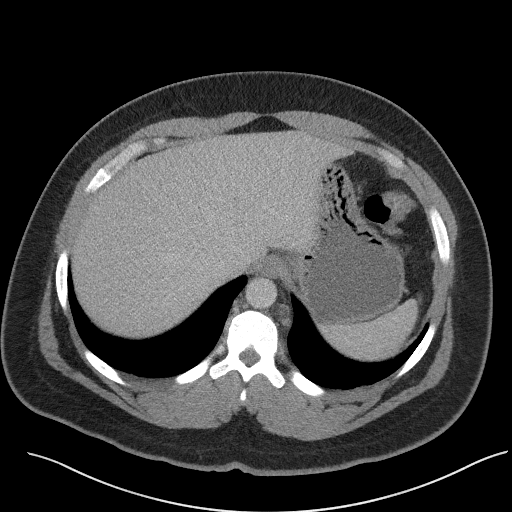
[im 96/101  soft-tissue]
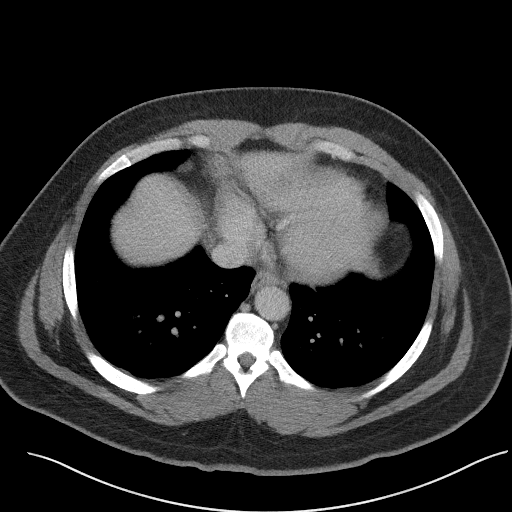

[Series 5: coronal st · coronal · 0.87mm/px · 3 of 115 slices shown]
[im 39/115  soft-tissue]
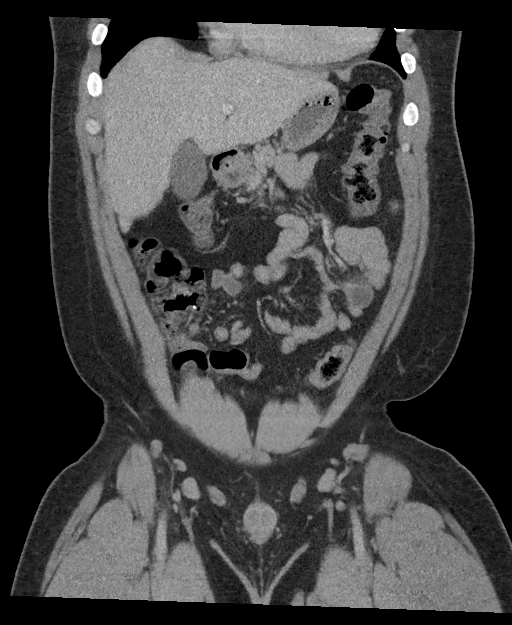
[im 51/115  soft-tissue]
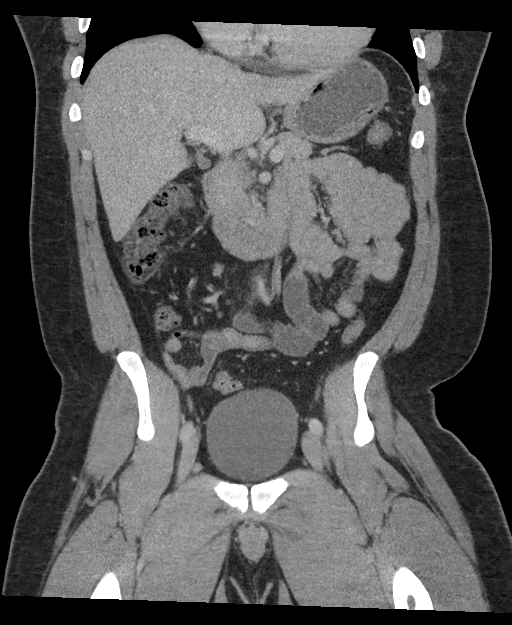
[im 64/115  soft-tissue]
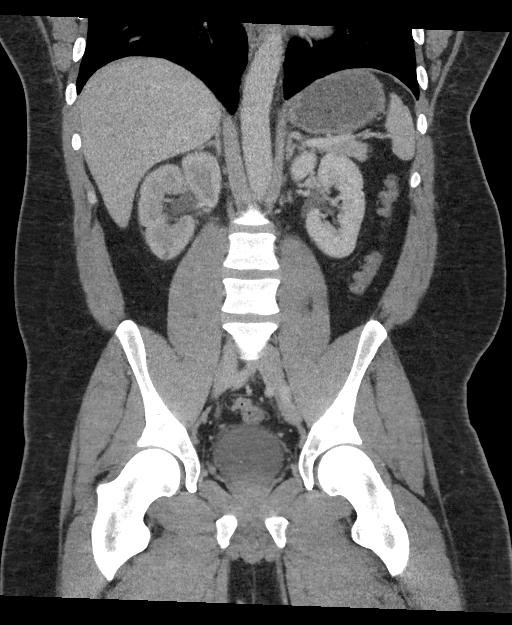

[16 of 46 positions shown; findings below may reference images not displayed]

FINDINGS: Lower chest: Insert lung bases

Hepatobiliary: No focal hepatic lesions or intrahepatic biliary
dilatation. The gallbladder is normal. No common bile duct
dilatation.

Pancreas: No mass, inflammation or ductal dilatation.

Spleen: Normal size. No focal lesions.

Adrenals/Urinary Tract: The adrenal glands are normal.

There are small bilateral renal calculi.

Obstructive findings involving the right kidney with decreased
perfusion, perinephric interstitial changes and mild right-sided
hydroureteronephrosis. No right-sided ureteral calculi but there is
a 2.5 mm calculus in the bladder likely recently passed from the
right ureter.

The left kidney and ureter are normal.

No bladder lesions.

Stomach/Bowel: The stomach, duodenum, small bowel and colon are
grossly normal without oral contrast. No inflammatory changes, mass
lesions or obstructive findings. The appendix is normal.

Vascular/Lymphatic: The aorta is normal in caliber. No dissection.
The branch vessels are patent. The major venous structures are
patent. No mesenteric or retroperitoneal mass or adenopathy. Small
scattered lymph nodes are noted.

Reproductive: The prostate gland and seminal vesicles are
unremarkable.

Other: No pelvic mass or adenopathy. No free pelvic fluid
collections. No inguinal mass or adenopathy. Small periumbilical
abdominal wall hernia containing fat.

Musculoskeletal: No significant bony findings.
IMPRESSION: 1. 2.5 mm calculus in the bladder likely recently passed from the
right ureter. Residual mild right-sided hydroureteronephrosis.
2. Small bilateral renal calculi.
3. No other significant abdominal/pelvic findings, mass lesions or
adenopathy.

## 2020-05-28 MED ORDER — IOHEXOL 300 MG/ML  SOLN
100.0000 mL | Freq: Once | INTRAMUSCULAR | Status: AC | PRN
  Administered 2020-05-28: 100 mL via INTRAVENOUS

## 2020-05-28 MED ORDER — ONDANSETRON HCL 4 MG/2ML IJ SOLN
4.0000 mg | Freq: Once | INTRAMUSCULAR | Status: AC
  Administered 2020-05-28: 4 mg via INTRAVENOUS
  Filled 2020-05-28: qty 2

## 2020-05-28 MED ORDER — LACTATED RINGERS IV BOLUS
1000.0000 mL | Freq: Once | INTRAVENOUS | Status: AC
  Administered 2020-05-28: 1000 mL via INTRAVENOUS

## 2020-05-28 MED ORDER — KETOROLAC TROMETHAMINE 30 MG/ML IJ SOLN
15.0000 mg | INTRAMUSCULAR | Status: AC
  Administered 2020-05-28: 15 mg via INTRAVENOUS
  Filled 2020-05-28: qty 1

## 2020-05-28 MED ORDER — NAPROXEN 500 MG PO TABS: 500.0000 mg | ORAL_TABLET | Freq: Two times a day (BID) | ORAL | 0 refills | Status: DC

## 2020-05-28 NOTE — Discharge Instructions (Signed)
Your lab tests were normal.  Your CT scan shows a small 2 mm kidney stone that recently passed into your bladder.  Your pain should significantly improve now that the stone has passed.  Drink lots of water today to stay well-hydrated and promote frequent urination.  Take naproxen 500 mg 2 times a day for residual pain which should resolve entirely over the next few days.

## 2020-05-28 NOTE — ED Provider Notes (Signed)
[facility] Emergency Department Provider Note  ____________________________________________  Time seen: Approximately 5:50 AM  I have reviewed the triage vital signs and the nursing notes.   HISTORY  Chief Complaint Abdominal Pain    HPI Sean Oneill is a 34 y.o. male with a past history of hypertension who complains of right lower quadrant abdominal pain that started gradually yesterday afternoon, constant, no aggravating or alleviating factors, nonradiating.  No vomiting diarrhea or constipation.  Reports subjective fever, no dysuria or hematuria.      Past medical history hypertension   There are no problems to display for this patient.    Past surgical history negative   Prior to Admission medications   Medication Sig Start Date End Date Taking? Authorizing Provider  naproxen (NAPROSYN) 500 MG tablet Take 1 tablet (500 mg total) by mouth 2 (two) times daily with a meal. 05/28/20   Sharman Cheek, MD  predniSONE (DELTASONE) 10 MG tablet Take 6 tablets  today, on day 2 take 5 tablets, day 3 take 4 tablets, day 4 take 3 tablets, day 5 take  2 tablets and 1 tablet the last day 10/01/17   Tommi Rumps, PA-C     Allergies Patient has no known allergies.   No family history on file.  Social History Social History   Tobacco Use  . Smoking status: Never Smoker  Substance Use Topics  . Alcohol use: No  . Drug use: No    Review of Systems  Constitutional:   No fever or chills.  ENT:   No sore throat. No rhinorrhea. Cardiovascular:   No chest pain or syncope. Respiratory:   No dyspnea or cough. Gastrointestinal:   Positive as above for abdominal pain without vomiting and diarrhea.  Musculoskeletal:   Negative for focal pain or swelling All other systems reviewed and are negative except as documented above in ROS and HPI.  ____________________________________________   PHYSICAL EXAM:  VITAL SIGNS: ED Triage Vitals [05/28/20  0522]  Enc Vitals Group     BP (!) 185/124     Pulse Rate 74     Resp 18     Temp 97.8 F (36.6 C)     Temp Source Oral     SpO2 98 %     Weight 270 lb (122.5 kg)     Height 5\' 6"  (1.676 m)     Head Circumference      Peak Flow      Pain Score 9     Pain Loc      Pain Edu?      Excl. in GC?     Vital signs reviewed, nursing assessments reviewed.   Constitutional:   Alert and oriented. Non-toxic appearance. Eyes:   Conjunctivae are normal. EOMI. PERRL. ENT      Head:   Normocephalic and atraumatic.      Nose:   Wearing a mask.      Mouth/Throat:   Wearing a mask.      Neck:   No meningismus. Full ROM. Hematological/Lymphatic/Immunilogical:   No cervical lymphadenopathy. Cardiovascular:   RRR. Symmetric bilateral radial and DP pulses.  No murmurs. Cap refill less than 2 seconds. Respiratory:   Normal respiratory effort without tachypnea/retractions. Breath sounds are clear and equal bilaterally. No wheezes/rales/rhonchi. Gastrointestinal:   Soft with right lower quadrant tenderness. Non distended. There is no CVA tenderness.  No rebound, rigidity, or guarding.  No hernias Musculoskeletal:   Normal range of motion in all extremities. No  joint effusions.  No lower extremity tenderness.  No edema. Neurologic:   Normal speech and language.  Motor grossly intact. No acute focal neurologic deficits are appreciated.  Skin:    Skin is warm, dry and intact. No rash noted.  No petechiae, purpura, or bullae.  ____________________________________________    LABS (pertinent positives/negatives) (all labs ordered are listed, but only abnormal results are displayed) Labs Reviewed  COMPREHENSIVE METABOLIC PANEL - Abnormal; Notable for the following components:      Result Value   Glucose, Bld 117 (*)    Creatinine, Ser 1.37 (*)    Total Protein 8.2 (*)    All other components within normal limits  CBC WITH DIFFERENTIAL/PLATELET - Abnormal; Notable for the following components:    WBC 12.2 (*)    MCV 75.5 (*)    MCH 24.5 (*)    RDW 16.4 (*)    Neutro Abs 8.0 (*)    Monocytes Absolute 1.5 (*)    All other components within normal limits  LIPASE, BLOOD   ____________________________________________   EKG    ____________________________________________    RADIOLOGY  CT ABDOMEN PELVIS W CONTRAST  Result Date: 05/28/2020 CLINICAL DATA:  Right lower quadrant abdominal pain. EXAM: CT ABDOMEN AND PELVIS WITH CONTRAST TECHNIQUE: Multidetector CT imaging of the abdomen and pelvis was performed using the standard protocol following bolus administration of intravenous contrast. CONTRAST:  OMNIPAQUE IOHEXOL 300 MG/ML  SOLN COMPARISON:  None. FINDINGS: Lower chest: Insert lung bases Hepatobiliary: No focal hepatic lesions or intrahepatic biliary dilatation. The gallbladder is normal. No common bile duct dilatation. Pancreas: No mass, inflammation or ductal dilatation. Spleen: Normal size. No focal lesions. Adrenals/Urinary Tract: The adrenal glands are normal. There are small bilateral renal calculi. Obstructive findings involving the right kidney with decreased perfusion, perinephric interstitial changes and mild right-sided hydroureteronephrosis. No right-sided ureteral calculi but there is a 2.5 mm calculus in the bladder likely recently passed from the right ureter. The left kidney and ureter are normal. No bladder lesions. Stomach/Bowel: The stomach, duodenum, small bowel and colon are grossly normal without oral contrast. No inflammatory changes, mass lesions or obstructive findings. The appendix is normal. Vascular/Lymphatic: The aorta is normal in caliber. No dissection. The branch vessels are patent. The major venous structures are patent. No mesenteric or retroperitoneal mass or adenopathy. Small scattered lymph nodes are noted. Reproductive: The prostate gland and seminal vesicles are unremarkable. Other: No pelvic mass or adenopathy. No free pelvic fluid  collections. No inguinal mass or adenopathy. Small periumbilical abdominal wall hernia containing fat. Musculoskeletal: No significant bony findings. IMPRESSION: 1. 2.5 mm calculus in the bladder likely recently passed from the right ureter. Residual mild right-sided hydroureteronephrosis. 2. Small bilateral renal calculi. 3. No other significant abdominal/pelvic findings, mass lesions or adenopathy. Electronically Signed   By: Rudie Meyer M.D.   On: 05/28/2020 06:55    ____________________________________________   PROCEDURES Procedures  ____________________________________________  DIFFERENTIAL DIAGNOSIS   Ureterolithiasis, appendicitis, cystitis, diverticulitis  CLINICAL IMPRESSION / ASSESSMENT AND PLAN / ED COURSE  Medications ordered in the ED: Medications  ketorolac (TORADOL) 30 MG/ML injection 15 mg (15 mg Intravenous Given 05/28/20 0605)  lactated ringers bolus 1,000 mL (1,000 mLs Intravenous New Bag/Given 05/28/20 0606)  ondansetron (ZOFRAN) injection 4 mg (4 mg Intravenous Given 05/28/20 0605)  iohexol (OMNIPAQUE) 300 MG/ML solution 100 mL (100 mLs Intravenous Contrast Given 05/28/20 0626)    Pertinent labs & imaging results that were available during my care of the patient were reviewed  by me and considered in my medical decision making (see chart for details).  Sean Oneill was evaluated in Emergency Department on 05/28/2020 for the symptoms described in the history of present illness. He was evaluated in the context of the global COVID-19 pandemic, which necessitated consideration that the patient might be at risk for infection with the SARS-CoV-2 virus that causes COVID-19. Institutional protocols and algorithms that pertain to the evaluation of patients at risk for COVID-19 are in a state of rapid change based on information released by regulatory bodies including the CDC and federal and state organizations. These policies and algorithms were followed during the patient's  care in the ED.   Patient presents with right lower quadrant pain and tenderness.  Vital signs unremarkable, not in distress, rest of exam unremarkable.  Will check labs and obtain CT scan to evaluate for appendicitis versus kidney stone.  Will give IV fluids for hydration, IV Zofran 4 mg and IV Toradol 15 mg.  Clinical Course as of May 29 707  Fri May 28, 2020  4098 CT report shows 2 mm kidney stone that is passed to the bladder, consistent with renal colic which should not be resolved.  Will prescribe naproxen for residual pain, follow-up with PCP as needed.  Doubt UTI given lack of dysuria, normal vital signs, and without urinary obstruction UA not needed.   [PS]    Clinical Course User Index [PS] Sharman Cheek, MD     ____________________________________________   FINAL CLINICAL IMPRESSION(S) / ED DIAGNOSES    Final diagnoses:  RLQ abdominal pain  Kidney stone     ED Discharge Orders         Ordered    naproxen (NAPROSYN) 500 MG tablet  2 times daily with meals        05/28/20 0706          Portions of this note were generated with dragon dictation software. Dictation errors may occur despite best attempts at proofreading.   Sharman Cheek, MD 05/28/20 [phone]

## 2020-05-28 NOTE — ED Notes (Signed)
Patient and mother asked to speak with MD, Scotty Court about diagnosis

## 2020-05-28 NOTE — ED Triage Notes (Signed)
Pt in with co rlq pain that started tonight, radiates to groin. Pt denies any hx of kidney stones. Denies any dysuria but has had a fever.

## 2020-05-28 NOTE — ED Notes (Addendum)
Patient discharged home with mom, patient received discharge papers and prescription for Naproxen. Patient appropriate and cooperative. Vital signs taken. NAD noted.

## 2020-09-19 ENCOUNTER — Other Ambulatory Visit: Payer: BC Managed Care – PPO

## 2020-10-30 ENCOUNTER — Other Ambulatory Visit: Payer: Self-pay

## 2020-10-30 ENCOUNTER — Ambulatory Visit
Admission: RE | Admit: 2020-10-30 | Discharge: 2020-10-30 | Disposition: A | Discharge status: Home / Self Care | Payer: BC Managed Care – PPO | Source: Ambulatory Visit | Attending: Neurosurgery | Admitting: Neurosurgery

## 2020-10-30 DIAGNOSIS — G950 Syringomyelia and syringobulbia: Secondary | ICD-10-CM

## 2020-10-30 IMAGING — MR MR LUMBAR SPINE W/O CM
4 of 5 series · 23 of 48 positions shown · non-contrast
Comparison: Comparison made with previous MRI of the cervical spine
from [DATE].

CLINICAL DATA: Initial evaluation for right-sided numbness with
neck and back pain for several years. History of Chiari
malformation.

EXAM:
MRI THORACIC AND LUMBAR SPINE WITHOUT CONTRAST
TECHNIQUE: Multiplanar and multiecho pulse sequences of the thoracic and lumbar
spine were obtained without intravenous contrast.

[Series 2: T2 · sagittal · 4.0mm · 0.57mm/px · 6 of 16 slices shown (1 of 2)]
[im 1/16]
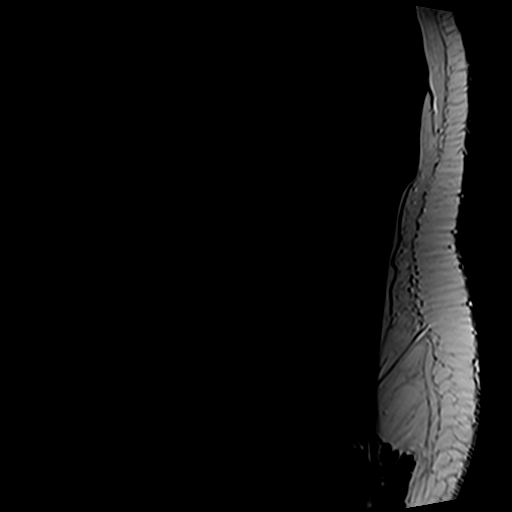
[im 4/16]
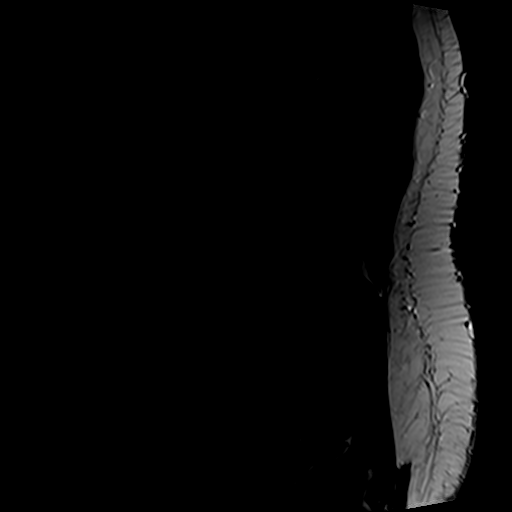
[im 7/16]
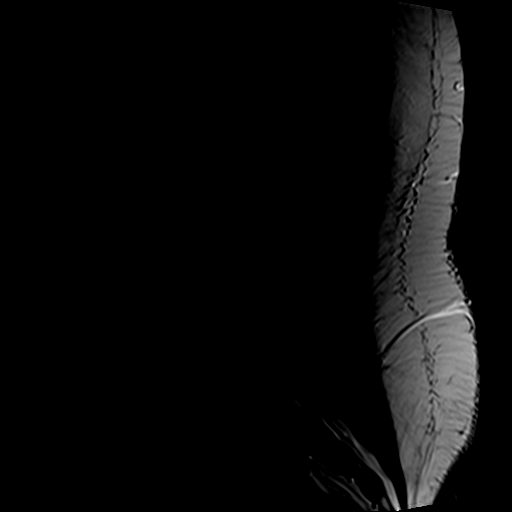
[im 10/16]
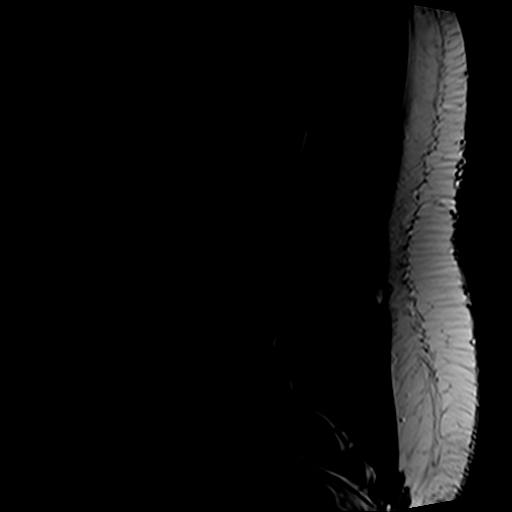
[im 13/16]
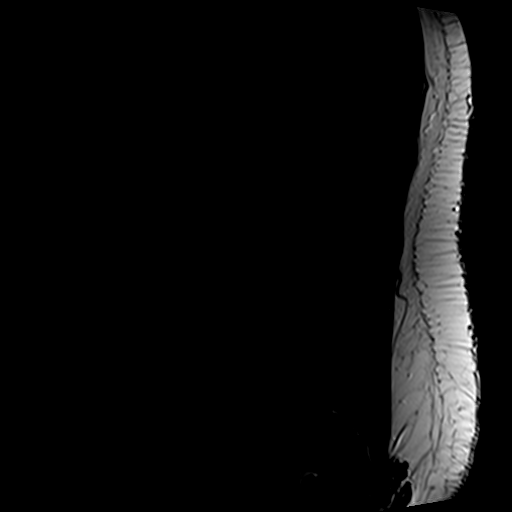
[im 16/16]
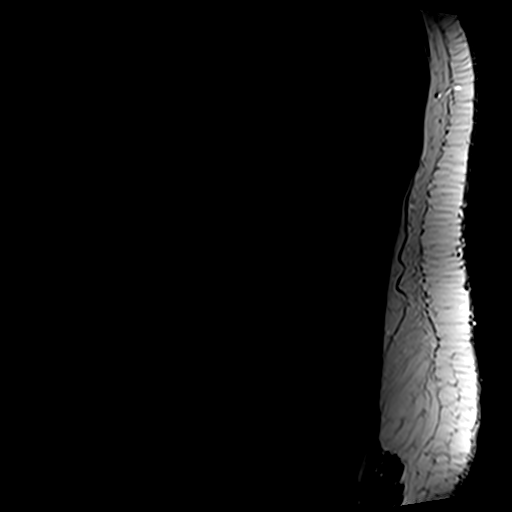

[Series 4: T1 · sagittal · 4.0mm · 0.57mm/px · 5 of 16 slices shown (1 of 2)]
[im 1/16]
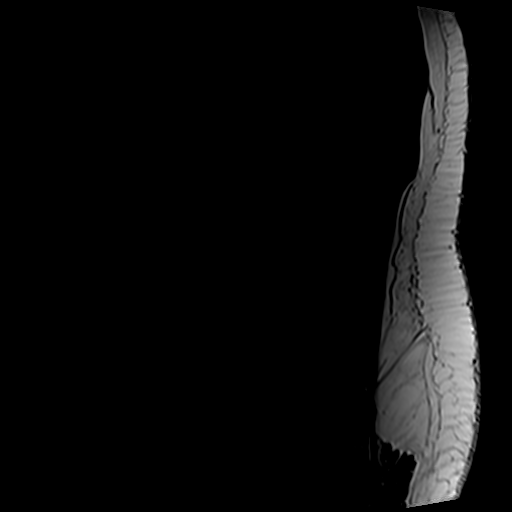
[im 4/16]
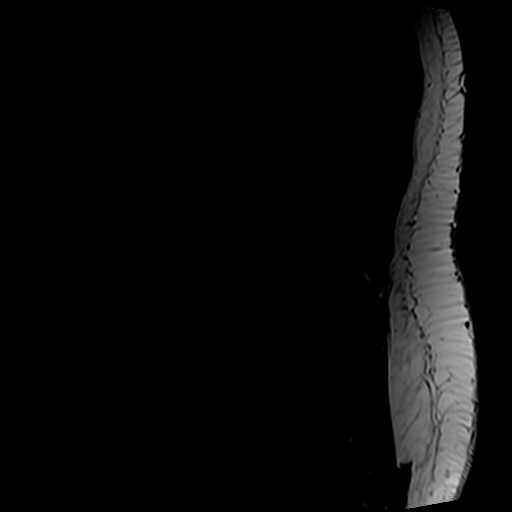
[im 7/16]
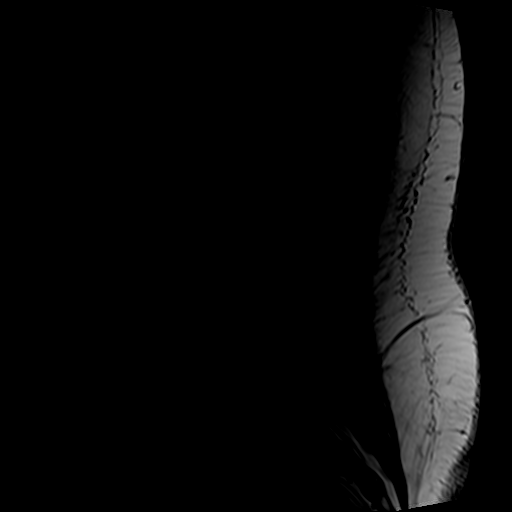
[im 10/16]
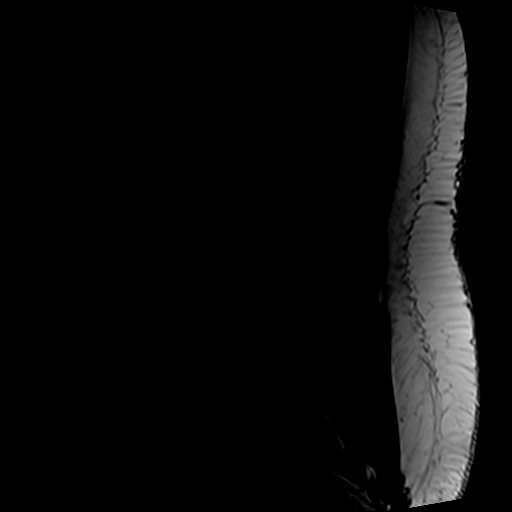
[im 16/16]
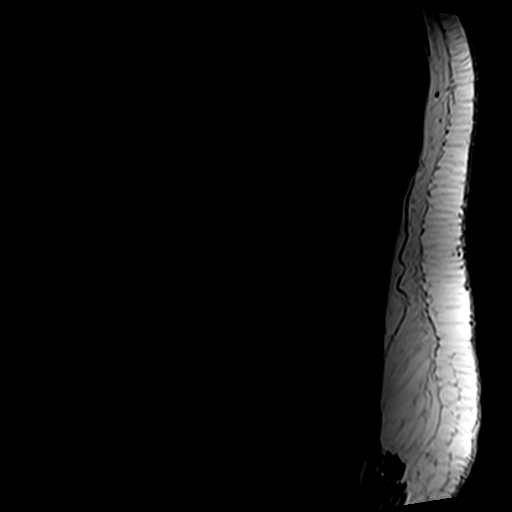

[Series 5: T2 · axial · 4.0mm · 0.70mm/px · z∈[-74,+139]mm · 9 of 38 slices shown (2 of 2)]
[im 1/38]
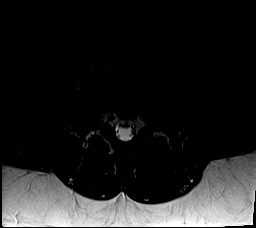
[im 6/38]
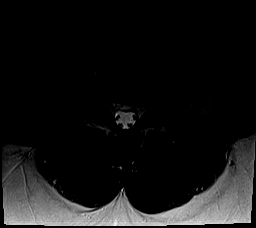
[im 11/38]
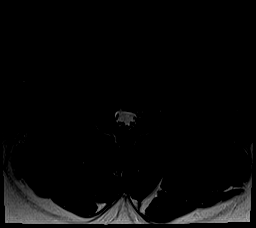
[im 16/38]
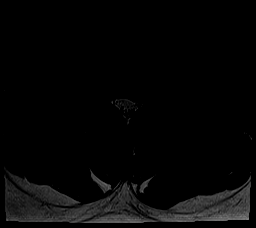
[im 19/38]
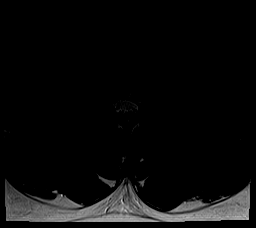
[im 22/38]
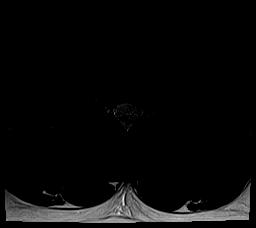
[im 27/38]
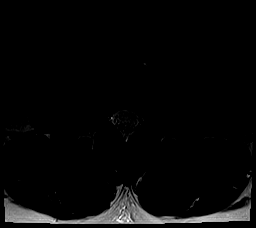
[im 32/38]
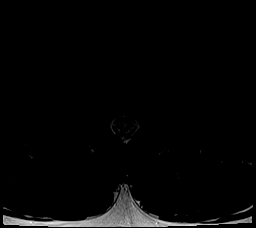
[im 38/38]
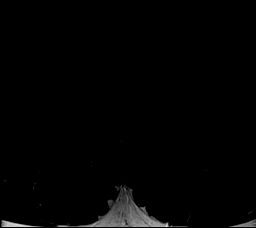

[Series 6: T1 · axial · 4.0mm · 0.35mm/px · z∈[-48,+108]mm · 3 of 38 slices shown (2 of 2)]
[im 6/38]
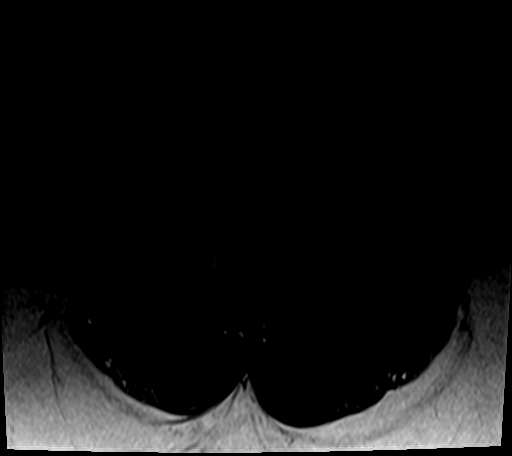
[im 19/38]
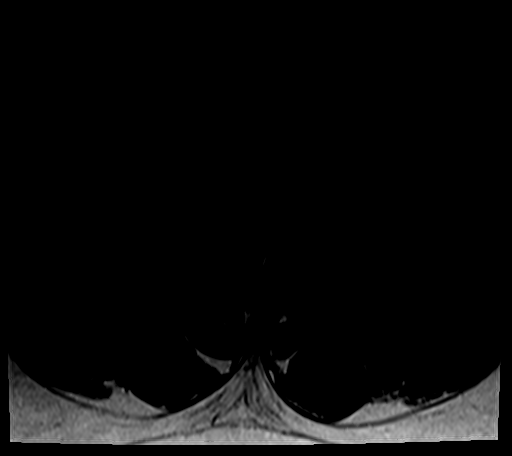
[im 32/38]
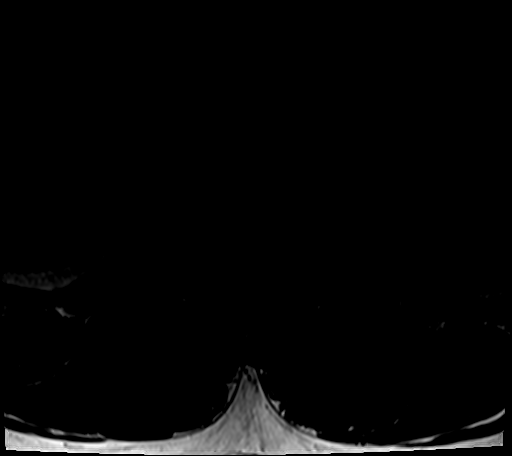

[23 of 48 positions shown; findings below may reference images not displayed]

FINDINGS: MRI THORACIC SPINE FINDINGS

Alignment: Mild diffuse dextroscoliosis with straightening of the
normal thoracic kyphosis. No listhesis.

Vertebrae: Vertebral body height well maintained without acute or
chronic fracture. Bone marrow signal intensity within normal limits.
No discrete or worrisome osseous lesions. No abnormal marrow edema.

Cord: Large hydrosyringomyelia involving the visualized lower
cervical spinal cord with extension into the upper thoracic cord
again seen. This measures up to 12 x 13 mm in greatest dimension at
the level of C7-T1 (series 7, image 3). Syrinx terminates at the
level of T3. Inferiorly, the thoracic cord is of normal caliber and
appearance without signal abnormality. Diffuse prominence of the
dorsal epidural fat noted.

Paraspinal and other soft tissues: Paraspinous soft tissues within
normal limits. Trace layering bilateral pleural effusions noted.
Visualized visceral structures otherwise unremarkable.

Disc levels:

T3-4: Tiny central disc protrusion indents the ventral thecal sac
(series 8, image 14). Secondary minimal flattening of the ventral
cord without significant spinal stenosis. Foramina remain patent.

T4-5: Small left paracentral disc extrusion with slight inferior
migration (series 8, image 18 on axial sequence, series 5, image 9
on sagittal sequence). Disc material contacts the left ventral cord
without significant cord deformity or stenosis. Foramina remain
patent.

Otherwise, no other significant disc pathology seen within the
thoracic spine. Intervertebral discs are well hydrated with
preserved disc height. No other disc bulge or focal disc herniation.
No significant underlying facet degeneration. No other canal or
neural foraminal stenosis or evidence for neural impingement.

MRI LUMBAR SPINE FINDINGS

Segmentation: Normal segmentation. Lowest well-formed disc space
labeled the L5-S1 level.

Alignment: Physiologic with preservation of the normal lumbar
lordosis. No listhesis or static subluxation.

Vertebrae: Vertebral body height maintained without evidence for
acute or chronic fracture. Bone marrow signal intensity within
normal limits. No discrete or worrisome osseous lesions. Minimal
discogenic reactive endplate change present about the L4-5 and L5-S1
interspaces. No other abnormal marrow edema.

Conus medullaris and cauda equina: Conus extends to the L1 level.
Conus and cauda equina appear normal.

Paraspinal and other soft tissues: Paraspinous soft tissues within
normal limits. 1.5 cm simple cyst noted within the interpolar right
kidney. Visualized visceral structures otherwise unremarkable.

Disc levels:

L1-2:  Unremarkable.

L2-3:  Unremarkable.

L3-4: Small left paracentral disc protrusion mildly indents the left
ventral thecal sac (series 5, image 24). Underlying mild facet
hypertrophy. Mild narrowing of the left lateral recess. Central
canal remains patent. No significant foraminal stenosis.

L4-5: Mild disc bulge with disc desiccation. Superimposed small left
foraminal disc protrusion contacts the exiting left L4 nerve root as
it courses through the left neural foramen (series 4, image 12).
Mild bilateral facet hypertrophy. No significant canal or lateral
recess stenosis. Mild left foraminal narrowing. Right neural foramen
remains widely patent.

L5-S1: Degenerative intervertebral disc space narrowing with diffuse
disc bulge and disc desiccation. Superimposed right subarticular
disc protrusion indents the right ventral thecal sac. Protruding
disc contacts and mildly displaces the descending right S1 nerve
root as it courses through the right lateral recess. Mild right
lateral recess stenosis. Central canal remains patent. No
significant foraminal stenosis.
IMPRESSION: MRI THORACIC SPINE IMPRESSION:

1. Large hydrosyringomyelia involving the visualized lower cervical
and upper thoracic spinal cord, terminating at the level of T3. This
measures up to 11 x 13 mm in maximal dimensions at the level of
C7-T1.
2. Small central and left paracentral disc protrusions at T3-4 and
T4-5 without significant stenosis as above.

MRI LUMBAR SPINE IMPRESSION:

1. Right subarticular disc protrusion at L5-S1, contacting and
mildly displacing the descending right S1 nerve root as it courses
through the right lateral recess. Finding could contribute to
right-sided radicular symptoms.
2. Small left foraminal disc protrusion at L4-5, potentially
irritating the exiting left L4 nerve root.
3. Small left paracentral disc protrusion at L3-4 with resultant
mild left lateral recess stenosis.

## 2020-10-30 IMAGING — MR MR THORACIC SPINE W/O CM
4 of 5 series · 14 of 48 positions shown · non-contrast
Comparison: Comparison made with previous MRI of the cervical spine
from [DATE].

CLINICAL DATA: Initial evaluation for right-sided numbness with
neck and back pain for several years. History of Chiari
malformation.

EXAM:
MRI THORACIC AND LUMBAR SPINE WITHOUT CONTRAST
TECHNIQUE: Multiplanar and multiecho pulse sequences of the thoracic and lumbar
spine were obtained without intravenous contrast.

[Series 4: STIR · sagittal · 3.0mm · 1.09mm/px · 3 of 19 slices shown]
[im 4/19]
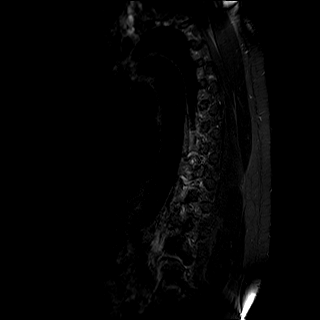
[im 11/19]
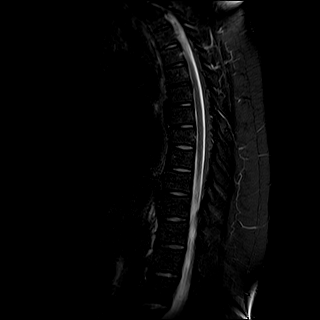
[im 19/19]
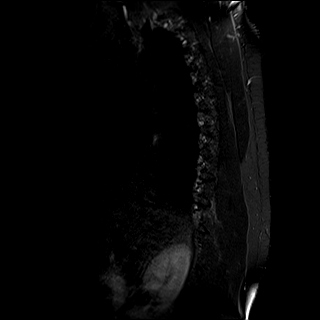

[Series 5: T2 post-contrast · sagittal · 3.0mm · 0.55mm/px · 5 of 19 slices shown]
[im 1/19]
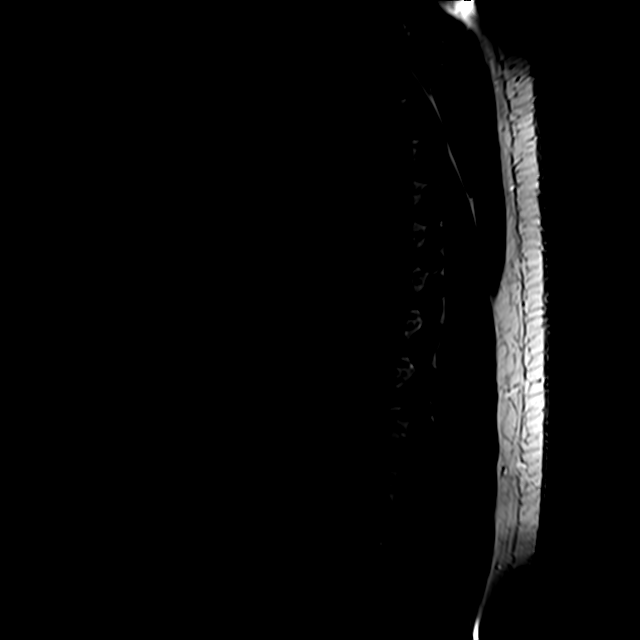
[im 4/19]
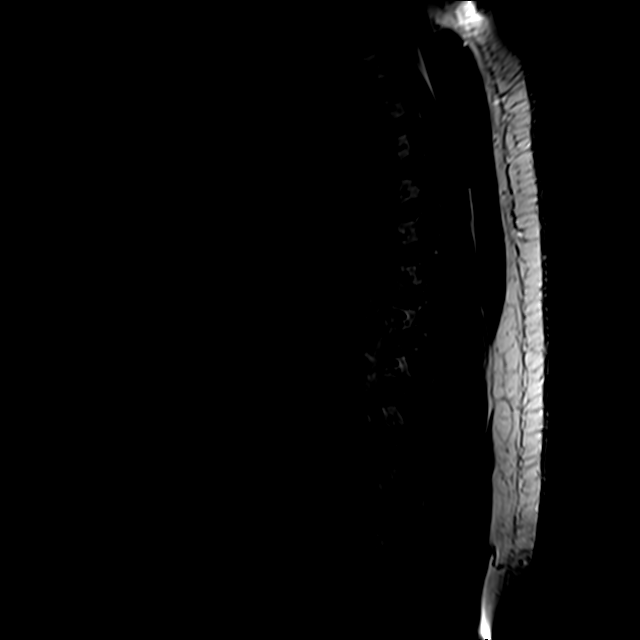
[im 7/19]
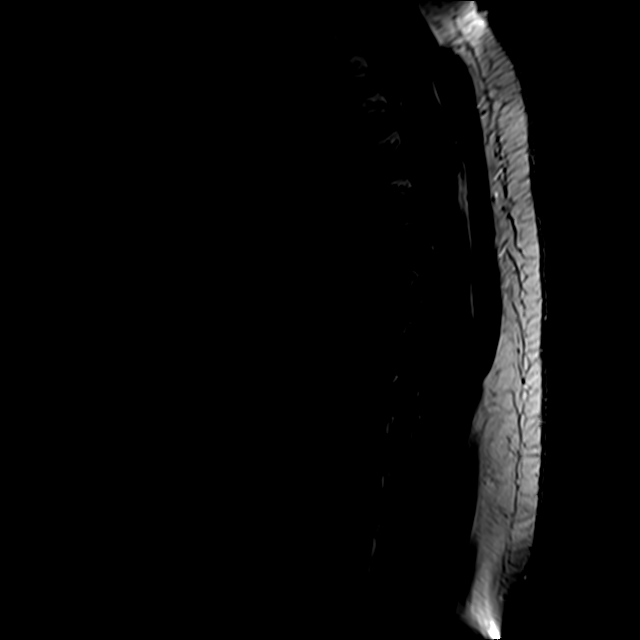
[im 10/19]
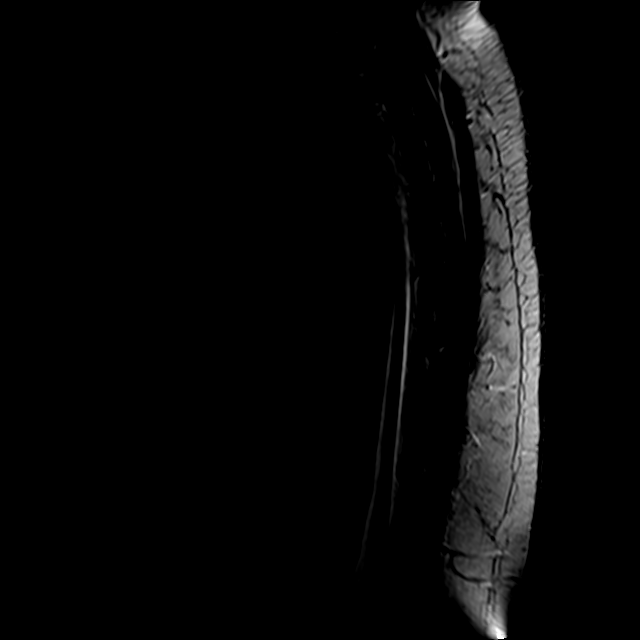
[im 16/19]
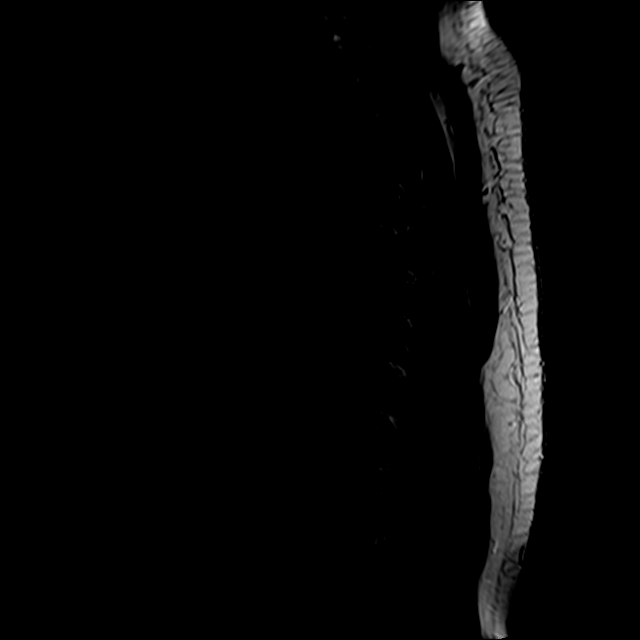

[Series 6: T1 · sagittal · 3.0mm · 0.55mm/px · 3 of 19 slices shown]
[im 4/19]
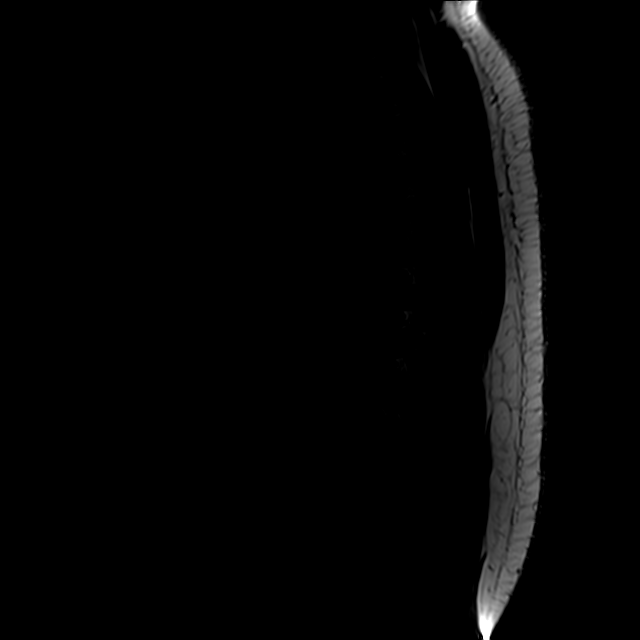
[im 10/19]
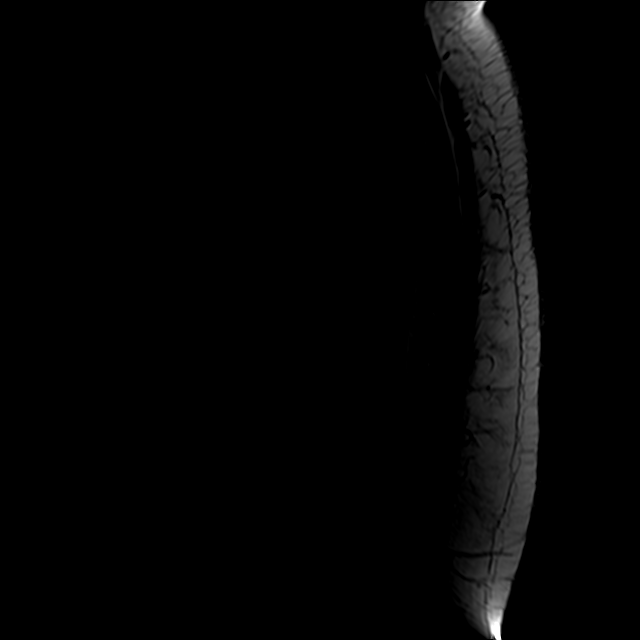
[im 16/19]
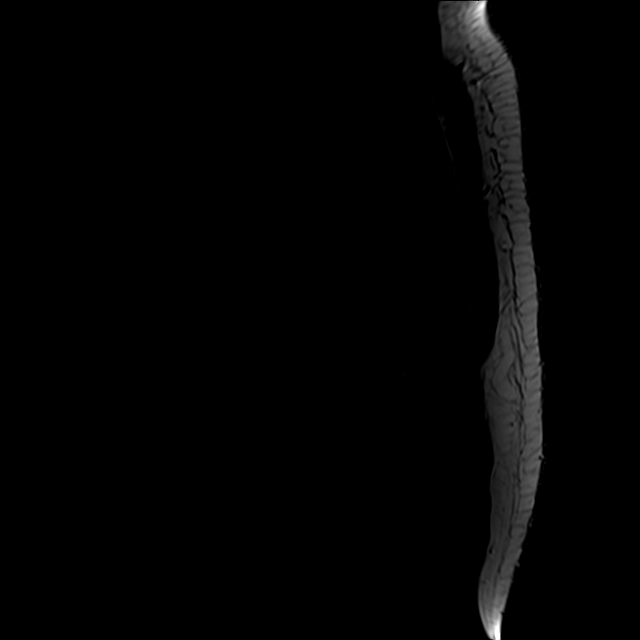

[Series 7: T2 · axial · 4.0mm · 0.39mm/px · z∈[-255,-72]mm · 3 of 39 slices shown]
[im 6/39]
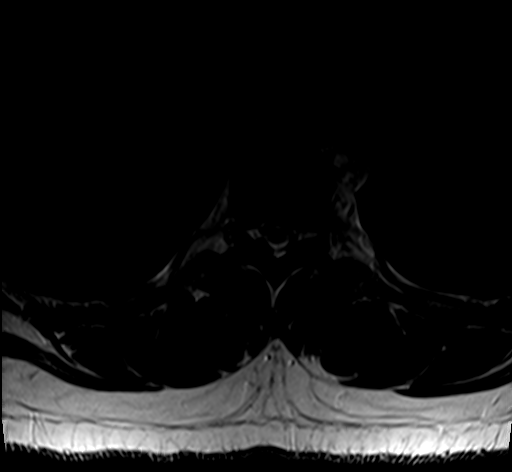
[im 21/39]
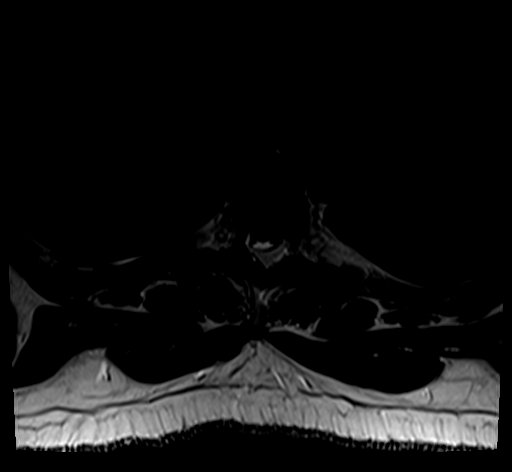
[im 33/39]
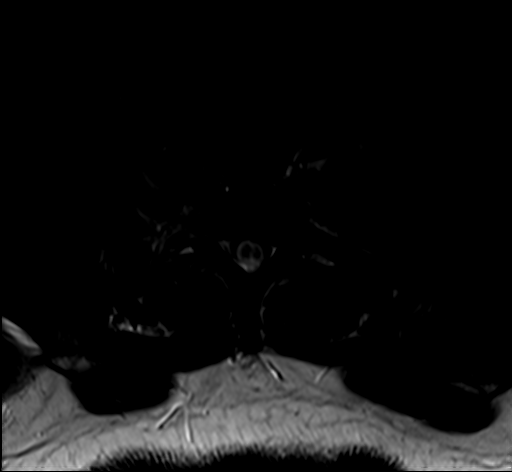

[14 of 48 positions shown; findings below may reference images not displayed]

FINDINGS: MRI THORACIC SPINE FINDINGS

Alignment: Mild diffuse dextroscoliosis with straightening of the
normal thoracic kyphosis. No listhesis.

Vertebrae: Vertebral body height well maintained without acute or
chronic fracture. Bone marrow signal intensity within normal limits.
No discrete or worrisome osseous lesions. No abnormal marrow edema.

Cord: Large hydrosyringomyelia involving the visualized lower
cervical spinal cord with extension into the upper thoracic cord
again seen. This measures up to 12 x 13 mm in greatest dimension at
the level of C7-T1 (series 7, image 3). Syrinx terminates at the
level of T3. Inferiorly, the thoracic cord is of normal caliber and
appearance without signal abnormality. Diffuse prominence of the
dorsal epidural fat noted.

Paraspinal and other soft tissues: Paraspinous soft tissues within
normal limits. Trace layering bilateral pleural effusions noted.
Visualized visceral structures otherwise unremarkable.

Disc levels:

T3-4: Tiny central disc protrusion indents the ventral thecal sac
(series 8, image 14). Secondary minimal flattening of the ventral
cord without significant spinal stenosis. Foramina remain patent.

T4-5: Small left paracentral disc extrusion with slight inferior
migration (series 8, image 18 on axial sequence, series 5, image 9
on sagittal sequence). Disc material contacts the left ventral cord
without significant cord deformity or stenosis. Foramina remain
patent.

Otherwise, no other significant disc pathology seen within the
thoracic spine. Intervertebral discs are well hydrated with
preserved disc height. No other disc bulge or focal disc herniation.
No significant underlying facet degeneration. No other canal or
neural foraminal stenosis or evidence for neural impingement.

MRI LUMBAR SPINE FINDINGS

Segmentation: Normal segmentation. Lowest well-formed disc space
labeled the L5-S1 level.

Alignment: Physiologic with preservation of the normal lumbar
lordosis. No listhesis or static subluxation.

Vertebrae: Vertebral body height maintained without evidence for
acute or chronic fracture. Bone marrow signal intensity within
normal limits. No discrete or worrisome osseous lesions. Minimal
discogenic reactive endplate change present about the L4-5 and L5-S1
interspaces. No other abnormal marrow edema.

Conus medullaris and cauda equina: Conus extends to the L1 level.
Conus and cauda equina appear normal.

Paraspinal and other soft tissues: Paraspinous soft tissues within
normal limits. 1.5 cm simple cyst noted within the interpolar right
kidney. Visualized visceral structures otherwise unremarkable.

Disc levels:

L1-2:  Unremarkable.

L2-3:  Unremarkable.

L3-4: Small left paracentral disc protrusion mildly indents the left
ventral thecal sac (series 5, image 24). Underlying mild facet
hypertrophy. Mild narrowing of the left lateral recess. Central
canal remains patent. No significant foraminal stenosis.

L4-5: Mild disc bulge with disc desiccation. Superimposed small left
foraminal disc protrusion contacts the exiting left L4 nerve root as
it courses through the left neural foramen (series 4, image 12).
Mild bilateral facet hypertrophy. No significant canal or lateral
recess stenosis. Mild left foraminal narrowing. Right neural foramen
remains widely patent.

L5-S1: Degenerative intervertebral disc space narrowing with diffuse
disc bulge and disc desiccation. Superimposed right subarticular
disc protrusion indents the right ventral thecal sac. Protruding
disc contacts and mildly displaces the descending right S1 nerve
root as it courses through the right lateral recess. Mild right
lateral recess stenosis. Central canal remains patent. No
significant foraminal stenosis.
IMPRESSION: MRI THORACIC SPINE IMPRESSION:

1. Large hydrosyringomyelia involving the visualized lower cervical
and upper thoracic spinal cord, terminating at the level of T3. This
measures up to 11 x 13 mm in maximal dimensions at the level of
C7-T1.
2. Small central and left paracentral disc protrusions at T3-4 and
T4-5 without significant stenosis as above.

MRI LUMBAR SPINE IMPRESSION:

1. Right subarticular disc protrusion at L5-S1, contacting and
mildly displacing the descending right S1 nerve root as it courses
through the right lateral recess. Finding could contribute to
right-sided radicular symptoms.
2. Small left foraminal disc protrusion at L4-5, potentially
irritating the exiting left L4 nerve root.
3. Small left paracentral disc protrusion at L3-4 with resultant
mild left lateral recess stenosis.

## 2020-11-05 ENCOUNTER — Other Ambulatory Visit: Payer: Self-pay | Admitting: Neurosurgery

## 2020-11-15 ENCOUNTER — Telehealth: Payer: BC Managed Care – PPO | Admitting: Family

## 2020-11-15 DIAGNOSIS — M549 Dorsalgia, unspecified: Secondary | ICD-10-CM | POA: Diagnosis not present

## 2020-11-15 MED ORDER — PREDNISONE 10 MG (21) PO TBPK: ORAL_TABLET | ORAL | 0 refills | Status: DC

## 2020-11-15 MED ORDER — NAPROXEN 500 MG PO TABS: 500.0000 mg | ORAL_TABLET | Freq: Two times a day (BID) | ORAL | 0 refills | Status: DC

## 2020-11-15 MED ORDER — BACLOFEN 10 MG PO TABS: 10.0000 mg | ORAL_TABLET | Freq: Three times a day (TID) | ORAL | 0 refills | Status: DC

## 2020-11-15 NOTE — Progress Notes (Signed)

## 2020-11-19 ENCOUNTER — Encounter
Admission: RE | Admit: 2020-11-19 | Discharge: 2020-11-19 | Disposition: A | Discharge status: Home / Self Care | Payer: BC Managed Care – PPO | Source: Ambulatory Visit | Attending: Neurosurgery | Admitting: Neurosurgery

## 2020-11-19 ENCOUNTER — Other Ambulatory Visit: Payer: Self-pay

## 2020-11-19 DIAGNOSIS — I10 Essential (primary) hypertension: Secondary | ICD-10-CM | POA: Diagnosis not present

## 2020-11-19 DIAGNOSIS — Z01818 Encounter for other preprocedural examination: Secondary | ICD-10-CM | POA: Insufficient documentation

## 2020-11-19 HISTORY — DX: Personal history of urinary calculi: Z87.442

## 2020-11-19 HISTORY — DX: Essential (primary) hypertension: I10

## 2020-11-19 HISTORY — DX: Gastro-esophageal reflux disease without esophagitis: K21.9

## 2020-11-19 HISTORY — DX: Compression of brain: G93.5

## 2020-11-19 LAB — PROTIME-INR
INR: 1 (ref 0.8–1.2)
Prothrombin Time: 13.6 seconds (ref 11.4–15.2)

## 2020-11-19 LAB — URINALYSIS, ROUTINE W REFLEX MICROSCOPIC
Bacteria, UA: NONE SEEN
Bilirubin Urine: NEGATIVE
Glucose, UA: NEGATIVE mg/dL
Hgb urine dipstick: NEGATIVE
Ketones, ur: NEGATIVE mg/dL
Leukocytes,Ua: NEGATIVE
Nitrite: NEGATIVE
Protein, ur: NEGATIVE mg/dL
Specific Gravity, Urine: 1.026 (ref 1.005–1.030)
Squamous Epithelial / HPF: NONE SEEN (ref 0–5)
pH: 6 (ref 5.0–8.0)

## 2020-11-19 LAB — BASIC METABOLIC PANEL
Anion gap: 6 (ref 5–15)
BUN: 21 mg/dL — ABNORMAL HIGH (ref 6–20)
CO2: 27 mmol/L (ref 22–32)
Calcium: 8.9 mg/dL (ref 8.9–10.3)
Chloride: 108 mmol/L (ref 98–111)
Creatinine, Ser: 1.31 mg/dL — ABNORMAL HIGH (ref 0.61–1.24)
GFR, Estimated: >60 mL/min (ref >60)
Glucose, Bld: 86 mg/dL (ref 70–99)
Potassium: 3.6 mmol/L (ref 3.5–5.1)
Sodium: 141 mmol/L (ref 135–145)

## 2020-11-19 LAB — CBC
HCT: 40.8 % (ref 39.0–52.0)
Hemoglobin: 13.1 g/dL (ref 13.0–17.0)
MCH: 23.7 pg — ABNORMAL LOW (ref 26.0–34.0)
MCHC: 32.1 g/dL (ref 30.0–36.0)
MCV: 73.9 fL — ABNORMAL LOW (ref 80.0–100.0)
Platelets: 328 10*3/uL (ref 150–400)
RBC: 5.52 MIL/uL (ref 4.22–5.81)
RDW: 18.3 % — ABNORMAL HIGH (ref 11.5–15.5)
WBC: 15.4 10*3/uL — ABNORMAL HIGH (ref 4.0–10.5)
nRBC: 0 % (ref 0.0–0.2)

## 2020-11-19 LAB — APTT: aPTT: 29 seconds (ref 24–36)

## 2020-11-19 LAB — SURGICAL PCR SCREEN
MRSA, PCR: NEGATIVE
Staphylococcus aureus: NEGATIVE

## 2020-11-19 NOTE — Patient Instructions (Addendum)
Your procedure is scheduled on:11-29-20 MONDAY Report to the Registration Desk on the 1st floor of the Medical Mall-Then proceed to the 2nd floor Surgery Desk in the Medical Mall To find out your arrival time, please call [phone] between 1PM - 3PM on:11-26-20 FRIDAY  REMEMBER: Instructions that are not followed completely may result in serious medical risk, up to and including death; or upon the discretion of your surgeon and anesthesiologist your surgery may need to be rescheduled.  Do not eat food after midnight the night before surgery.  No gum chewing, lozengers or hard candies.  You may however, drink CLEAR liquids up to 2 hours before you are scheduled to arrive for your surgery. Do not drink anything within 2 hours of your scheduled arrival time.  Clear liquids include: - water  - apple juice without pulp - gatorade - black coffee or tea (Do NOT add milk or creamers to the coffee or tea) Do NOT drink anything that is not on this list..  DO NOT TAKE ANY MEDICATION THE DAY OF SURGERY  One week prior to surgery: Stop Anti-inflammatories (NSAIDS) such as Advil, Aleve, Ibuprofen, Motrin, Naproxen, Naprosyn and Aspirin based products such as Excedrin, Goodys Powder, BC Powder-OK TO TAKE TYLENOL IF NEEDED  Stop ANY OVER THE COUNTER supplements/vitamins until after surgery-LAST DOSE OF  VITAMIN D ON 11-21-20 SUNDAY  No Alcohol for 24 hours before or after surgery.  No Smoking including e-cigarettes for 24 hours prior to surgery.  No chewable tobacco products for at least 6 hours prior to surgery.  No nicotine patches on the day of surgery.  Do not use any "recreational" drugs for at least a week prior to your surgery.  Please be advised that the combination of cocaine and anesthesia may have negative outcomes, up to and including death. If you test positive for cocaine, your surgery will be cancelled.  On the morning of surgery brush your teeth with toothpaste and water, you may  rinse your mouth with mouthwash if you wish. Do not swallow any toothpaste or mouthwash.  Do not wear jewelry, make-up, hairpins, clips or nail polish.  Do not wear lotions, powders, or perfumes.   Do not shave body from the neck down 48 hours prior to surgery just in case you cut yourself which could leave a site for infection.  Also, freshly shaved skin may become irritated if using the CHG soap.  Contact lenses, hearing aids and dentures may not be worn into surgery.  Do not bring valuables to the hospital. [facility] is not responsible for any missing/lost belongings or valuables.   Notify your doctor if there is any change in your medical condition (cold, fever, infection).  Wear comfortable clothing (specific to your surgery type) to the hospital.  Plan for stool softeners for home use; pain medications have a tendency to cause constipation. You can also help prevent constipation by eating foods high in fiber such as fruits and vegetables and drinking plenty of fluids as your diet allows.  After surgery, you can help prevent lung complications by doing breathing exercises.  Take deep breaths and cough every 1-2 hours. Your doctor may order a device called an Incentive Spirometer to help you take deep breaths. When coughing or sneezing, hold a pillow firmly against your incision with both hands. This is called "splinting." Doing this helps protect your incision. It also decreases belly discomfort.  If you are being admitted to the hospital overnight, leave your suitcase in the car.  After surgery it may be brought to your room.  If you are being discharged the day of surgery, you will not be allowed to drive home. You will need a responsible adult (18 years or older) to drive you home and stay with you that night.   If you are taking public transportation, you will need to have a responsible adult (18 years or older) with you. Please confirm with your physician that it is  acceptable to use public transportation.   Please call the Pre-admissions Testing Dept. at [phone] if you have any questions about these instructions.  Surgery Visitation Policy:  Patients undergoing a surgery or procedure may have one family member or support person with them as long as that person is not COVID-19 positive or experiencing its symptoms.  That person may remain in the waiting area during the procedure.  Inpatient Visitation:    Visiting hours are 7 a.m. to 8 p.m. Inpatients will be allowed two visitors daily. The visitors may change each day during the patient's stay. No visitors under the age of 22. Any visitor under the age of 77 must be accompanied by an adult. The visitor must pass COVID-19 screenings, use hand sanitizer when entering and exiting the patient's room and wear a mask at all times, including in the patient's room. Patients must also wear a mask when staff or their visitor are in the room. Masking is required regardless of vaccination status.

## 2020-11-25 ENCOUNTER — Other Ambulatory Visit: Payer: Self-pay

## 2020-11-25 ENCOUNTER — Other Ambulatory Visit
Admission: RE | Admit: 2020-11-25 | Discharge: 2020-11-25 | Disposition: A | Discharge status: Home / Self Care | Payer: BC Managed Care – PPO | Source: Ambulatory Visit | Attending: Neurosurgery | Admitting: Neurosurgery

## 2020-11-25 DIAGNOSIS — Z20822 Contact with and (suspected) exposure to covid-19: Secondary | ICD-10-CM | POA: Insufficient documentation

## 2020-11-25 DIAGNOSIS — Z01812 Encounter for preprocedural laboratory examination: Secondary | ICD-10-CM | POA: Diagnosis not present

## 2020-11-25 LAB — SARS CORONAVIRUS 2 (TAT 6-24 HRS): SARS Coronavirus 2: NEGATIVE

## 2020-11-26 LAB — TYPE AND SCREEN
ABO/RH(D): O NEG
Antibody Screen: NEGATIVE

## 2020-11-29 ENCOUNTER — Inpatient Hospital Stay
Admission: RE | Admit: 2020-11-29 | Discharge: 2020-12-01 | DRG: 026 | Disposition: A | Discharge status: Home / Self Care | Payer: BC Managed Care – PPO | Attending: Neurosurgery | Admitting: Neurosurgery

## 2020-11-29 ENCOUNTER — Other Ambulatory Visit: Payer: Self-pay

## 2020-11-29 ENCOUNTER — Encounter: Payer: Self-pay | Admitting: Neurosurgery

## 2020-11-29 ENCOUNTER — Encounter
Admission: RE | Disposition: A | Discharge status: Home / Self Care | Payer: Self-pay | Source: Home / Self Care | Attending: Neurosurgery

## 2020-11-29 ENCOUNTER — Inpatient Hospital Stay: Payer: BC Managed Care – PPO | Admitting: Registered Nurse

## 2020-11-29 DIAGNOSIS — G950 Syringomyelia and syringobulbia: Secondary | ICD-10-CM | POA: Diagnosis present

## 2020-11-29 DIAGNOSIS — G935 Compression of brain: Principal | ICD-10-CM | POA: Diagnosis present

## 2020-11-29 DIAGNOSIS — K219 Gastro-esophageal reflux disease without esophagitis: Secondary | ICD-10-CM | POA: Diagnosis present

## 2020-11-29 DIAGNOSIS — I10 Essential (primary) hypertension: Secondary | ICD-10-CM | POA: Diagnosis present

## 2020-11-29 DIAGNOSIS — Z6841 Body Mass Index (BMI) 40.0 and over, adult: Secondary | ICD-10-CM

## 2020-11-29 DIAGNOSIS — Z87442 Personal history of urinary calculi: Secondary | ICD-10-CM | POA: Diagnosis not present

## 2020-11-29 DIAGNOSIS — E669 Obesity, unspecified: Secondary | ICD-10-CM | POA: Diagnosis present

## 2020-11-29 DIAGNOSIS — Z79899 Other long term (current) drug therapy: Secondary | ICD-10-CM

## 2020-11-29 DIAGNOSIS — M542 Cervicalgia: Secondary | ICD-10-CM | POA: Diagnosis not present

## 2020-11-29 HISTORY — PX: CRANIECTOMY: SHX331

## 2020-11-29 LAB — URINE DRUG SCREEN, QUALITATIVE (ARMC ONLY)
Amphetamines, Ur Screen: NOT DETECTED
Barbiturates, Ur Screen: NOT DETECTED
Benzodiazepine, Ur Scrn: NOT DETECTED
Cannabinoid 50 Ng, Ur ~~LOC~~: NOT DETECTED
Cocaine Metabolite,Ur ~~LOC~~: NOT DETECTED
MDMA (Ecstasy)Ur Screen: NOT DETECTED
Methadone Scn, Ur: NOT DETECTED
Opiate, Ur Screen: NOT DETECTED
Phencyclidine (PCP) Ur S: NOT DETECTED
Tricyclic, Ur Screen: NOT DETECTED

## 2020-11-29 SURGERY — CRANIECTOMY POSTERIOR FOSSA DECOMPRESSION
Anesthesia: General

## 2020-11-29 MED ORDER — PROMETHAZINE HCL 25 MG/ML IJ SOLN: 6.2500 mg | INTRAMUSCULAR | Status: DC | PRN

## 2020-11-29 MED ORDER — FENTANYL CITRATE (PF) 250 MCG/5ML IJ SOLN
INTRAMUSCULAR | Status: AC
  Filled 2020-11-29: qty 5

## 2020-11-29 MED ORDER — ONDANSETRON HCL 4 MG/2ML IJ SOLN
4.0000 mg | Freq: Four times a day (QID) | INTRAMUSCULAR | Status: DC | PRN
  Administered 2020-11-29 – 2020-12-01 (×2): 4 mg via INTRAVENOUS
  Filled 2020-11-29 (×2): qty 2

## 2020-11-29 MED ORDER — ONDANSETRON HCL 4 MG/2ML IJ SOLN
INTRAMUSCULAR | Status: DC | PRN
  Administered 2020-11-29: 4 mg via INTRAVENOUS

## 2020-11-29 MED ORDER — SODIUM CHLORIDE 0.9% FLUSH: 3.0000 mL | INTRAVENOUS | Status: DC | PRN

## 2020-11-29 MED ORDER — OXYCODONE HCL 5 MG/5ML PO SOLN: 5.0000 mg | Freq: Once | ORAL | Status: DC | PRN

## 2020-11-29 MED ORDER — ORAL CARE MOUTH RINSE: 15.0000 mL | Freq: Once | OROMUCOSAL | Status: AC

## 2020-11-29 MED ORDER — FAMOTIDINE 20 MG PO TABS: 20.0000 mg | ORAL_TABLET | Freq: Once | ORAL | Status: AC

## 2020-11-29 MED ORDER — LIDOCAINE-EPINEPHRINE 1 %-1:100000 IJ SOLN
INTRAMUSCULAR | Status: DC | PRN
  Administered 2020-11-29: 20 mL

## 2020-11-29 MED ORDER — LIDOCAINE-EPINEPHRINE 1 %-1:100000 IJ SOLN
INTRAMUSCULAR | Status: AC
  Filled 2020-11-29: qty 1

## 2020-11-29 MED ORDER — OXYCODONE HCL 5 MG PO TABS
10.0000 mg | ORAL_TABLET | ORAL | Status: DC | PRN
  Administered 2020-11-29 – 2020-12-01 (×6): 10 mg via ORAL
  Filled 2020-11-29 (×6): qty 2

## 2020-11-29 MED ORDER — DEXAMETHASONE SODIUM PHOSPHATE 10 MG/ML IJ SOLN
INTRAMUSCULAR | Status: DC | PRN
  Administered 2020-11-29: 10 mg via INTRAVENOUS

## 2020-11-29 MED ORDER — CHLORHEXIDINE GLUCONATE 0.12 % MT SOLN: 15.0000 mL | Freq: Once | OROMUCOSAL | Status: AC

## 2020-11-29 MED ORDER — CEFAZOLIN SODIUM-DEXTROSE 2-4 GM/100ML-% IV SOLN
2.0000 g | Freq: Three times a day (TID) | INTRAVENOUS | Status: AC
Start: 2020-11-29 — End: 2020-11-30
  Administered 2020-11-29 – 2020-11-30 (×2): 2 g via INTRAVENOUS
  Filled 2020-11-29 (×2): qty 100

## 2020-11-29 MED ORDER — BACLOFEN 10 MG PO TABS
10.0000 mg | ORAL_TABLET | Freq: Three times a day (TID) | ORAL | Status: DC
  Administered 2020-11-29 – 2020-11-30 (×5): 10 mg via ORAL
  Filled 2020-11-29 (×9): qty 1

## 2020-11-29 MED ORDER — THROMBIN 5000 UNITS EX SOLR
CUTANEOUS | Status: DC | PRN
  Administered 2020-11-29: 5000 [IU] via TOPICAL

## 2020-11-29 MED ORDER — CEFAZOLIN IN SODIUM CHLORIDE 3-0.9 GM/100ML-% IV SOLN
3.0000 g | INTRAVENOUS | Status: AC
  Administered 2020-11-29: 3 g via INTRAVENOUS
  Filled 2020-11-29: qty 100

## 2020-11-29 MED ORDER — SODIUM CHLORIDE 0.9 % IV SOLN
250.0000 mL | INTRAVENOUS | Status: DC
  Administered 2020-11-29: 250 mL via INTRAVENOUS

## 2020-11-29 MED ORDER — DEXTROSE 5 % IV SOLN
3.0000 g | INTRAVENOUS | Status: DC
  Filled 2020-11-29: qty 3000

## 2020-11-29 MED ORDER — ROCURONIUM BROMIDE 100 MG/10ML IV SOLN
INTRAVENOUS | Status: DC | PRN
  Administered 2020-11-29: 15 mg via INTRAVENOUS
  Administered 2020-11-29: 30 mg via INTRAVENOUS

## 2020-11-29 MED ORDER — POTASSIUM CHLORIDE IN NACL 20-0.9 MEQ/L-% IV SOLN
INTRAVENOUS | Status: DC
  Filled 2020-11-29 (×3): qty 1000

## 2020-11-29 MED ORDER — SODIUM CHLORIDE 0.9% FLUSH
3.0000 mL | Freq: Two times a day (BID) | INTRAVENOUS | Status: DC
  Administered 2020-11-29 – 2020-11-30 (×3): 3 mL via INTRAVENOUS

## 2020-11-29 MED ORDER — FENTANYL CITRATE (PF) 100 MCG/2ML IJ SOLN: 25.0000 ug | INTRAMUSCULAR | Status: DC | PRN

## 2020-11-29 MED ORDER — FENTANYL CITRATE (PF) 100 MCG/2ML IJ SOLN
INTRAMUSCULAR | Status: AC
  Filled 2020-11-29: qty 2

## 2020-11-29 MED ORDER — SODIUM CHLORIDE 0.9 % IV SOLN
INTRAVENOUS | Status: DC | PRN
  Administered 2020-11-29: 25 ug/min via INTRAVENOUS

## 2020-11-29 MED ORDER — BACITRACIN 500 UNIT/GM EX OINT
TOPICAL_OINTMENT | CUTANEOUS | Status: DC | PRN
  Administered 2020-11-29: 1 via TOPICAL

## 2020-11-29 MED ORDER — LIDOCAINE HCL (CARDIAC) PF 100 MG/5ML IV SOSY
PREFILLED_SYRINGE | INTRAVENOUS | Status: DC | PRN
  Administered 2020-11-29: 100 mg via INTRAVENOUS

## 2020-11-29 MED ORDER — BACITRACIN ZINC 500 UNIT/GM EX OINT
TOPICAL_OINTMENT | CUTANEOUS | Status: AC
  Filled 2020-11-29: qty 28.35

## 2020-11-29 MED ORDER — MEPERIDINE HCL 25 MG/ML IJ SOLN: 6.2500 mg | INTRAMUSCULAR | Status: DC | PRN

## 2020-11-29 MED ORDER — VITAMIN D 25 MCG (1000 UNIT) PO TABS
1000.0000 [IU] | ORAL_TABLET | Freq: Every day | ORAL | Status: DC
  Administered 2020-11-30: 1000 [IU] via ORAL
  Filled 2020-11-29: qty 1

## 2020-11-29 MED ORDER — OXYCODONE HCL 5 MG PO TABS: 5.0000 mg | ORAL_TABLET | Freq: Once | ORAL | Status: DC | PRN

## 2020-11-29 MED ORDER — LOSARTAN POTASSIUM 50 MG PO TABS
100.0000 mg | ORAL_TABLET | ORAL | Status: DC
  Administered 2020-11-30 – 2020-12-01 (×2): 100 mg via ORAL
  Filled 2020-11-29 (×2): qty 2

## 2020-11-29 MED ORDER — ONDANSETRON HCL 4 MG PO TABS
4.0000 mg | ORAL_TABLET | Freq: Four times a day (QID) | ORAL | Status: DC | PRN
  Filled 2020-11-29: qty 1

## 2020-11-29 MED ORDER — PROPOFOL 10 MG/ML IV BOLUS
INTRAVENOUS | Status: DC | PRN
  Administered 2020-11-29: 200 mg via INTRAVENOUS

## 2020-11-29 MED ORDER — OXYCODONE HCL 5 MG PO TABS
5.0000 mg | ORAL_TABLET | ORAL | Status: DC | PRN
  Administered 2020-11-29: 5 mg via ORAL
  Filled 2020-11-29: qty 1

## 2020-11-29 MED ORDER — FAMOTIDINE 20 MG PO TABS
ORAL_TABLET | ORAL | Status: AC
  Administered 2020-11-29: 20 mg via ORAL
  Filled 2020-11-29: qty 1

## 2020-11-29 MED ORDER — THROMBIN 5000 UNITS EX SOLR
CUTANEOUS | Status: AC
  Filled 2020-11-29: qty 5000

## 2020-11-29 MED ORDER — FENTANYL CITRATE (PF) 100 MCG/2ML IJ SOLN
INTRAMUSCULAR | Status: DC | PRN
  Administered 2020-11-29: 100 ug via INTRAVENOUS
  Administered 2020-11-29: 50 ug via INTRAVENOUS
  Administered 2020-11-29: 25 ug via INTRAVENOUS
  Administered 2020-11-29: 50 ug via INTRAVENOUS
  Administered 2020-11-29: 25 ug via INTRAVENOUS

## 2020-11-29 MED ORDER — PROPOFOL 10 MG/ML IV BOLUS
INTRAVENOUS | Status: AC
  Filled 2020-11-29: qty 20

## 2020-11-29 MED ORDER — ROCURONIUM BROMIDE 10 MG/ML (PF) SYRINGE
PREFILLED_SYRINGE | INTRAVENOUS | Status: AC
  Filled 2020-11-29: qty 10

## 2020-11-29 MED ORDER — ACETAMINOPHEN 10 MG/ML IV SOLN
INTRAVENOUS | Status: DC | PRN
  Administered 2020-11-29: 1000 mg via INTRAVENOUS

## 2020-11-29 MED ORDER — ACETAMINOPHEN 10 MG/ML IV SOLN
INTRAVENOUS | Status: AC
  Filled 2020-11-29: qty 100

## 2020-11-29 MED ORDER — KETOROLAC TROMETHAMINE 15 MG/ML IJ SOLN
7.5000 mg | Freq: Four times a day (QID) | INTRAMUSCULAR | Status: AC
  Administered 2020-11-29 – 2020-11-30 (×4): 7.5 mg via INTRAVENOUS
  Filled 2020-11-29 (×4): qty 1

## 2020-11-29 MED ORDER — ACETAMINOPHEN 650 MG RE SUPP: 650.0000 mg | RECTAL | Status: DC | PRN

## 2020-11-29 MED ORDER — SUCCINYLCHOLINE CHLORIDE 20 MG/ML IJ SOLN
INTRAMUSCULAR | Status: DC | PRN
  Administered 2020-11-29: 140 mg via INTRAVENOUS

## 2020-11-29 MED ORDER — CYCLOBENZAPRINE HCL 10 MG PO TABS
10.0000 mg | ORAL_TABLET | Freq: Three times a day (TID) | ORAL | Status: DC | PRN
  Administered 2020-12-01: 10 mg via ORAL
  Filled 2020-11-29: qty 1

## 2020-11-29 MED ORDER — CHLORHEXIDINE GLUCONATE 0.12 % MT SOLN
OROMUCOSAL | Status: AC
  Administered 2020-11-29: 15 mL via OROMUCOSAL
  Filled 2020-11-29: qty 15

## 2020-11-29 MED ORDER — PHENYLEPHRINE HCL (PRESSORS) 10 MG/ML IV SOLN
INTRAVENOUS | Status: DC | PRN
  Administered 2020-11-29 (×2): 100 ug via INTRAVENOUS

## 2020-11-29 MED ORDER — MIDAZOLAM HCL 2 MG/2ML IJ SOLN
INTRAMUSCULAR | Status: DC | PRN
  Administered 2020-11-29: 2 mg via INTRAVENOUS

## 2020-11-29 MED ORDER — SUGAMMADEX SODIUM 500 MG/5ML IV SOLN
INTRAVENOUS | Status: AC
  Filled 2020-11-29: qty 5

## 2020-11-29 MED ORDER — DEXAMETHASONE SODIUM PHOSPHATE 4 MG/ML IJ SOLN
4.0000 mg | Freq: Four times a day (QID) | INTRAMUSCULAR | Status: DC
  Administered 2020-11-29 – 2020-11-30 (×3): 4 mg via INTRAVENOUS
  Filled 2020-11-29 (×4): qty 1

## 2020-11-29 MED ORDER — PHENOL 1.4 % MT LIQD
1.0000 | OROMUCOSAL | Status: DC | PRN
  Filled 2020-11-29: qty 177

## 2020-11-29 MED ORDER — HYDROMORPHONE HCL 1 MG/ML IJ SOLN: 1.0000 mg | INTRAMUSCULAR | Status: DC | PRN

## 2020-11-29 MED ORDER — LACTATED RINGERS IV SOLN: INTRAVENOUS | Status: DC

## 2020-11-29 MED ORDER — ACETAMINOPHEN 325 MG PO TABS
650.0000 mg | ORAL_TABLET | ORAL | Status: DC | PRN
  Filled 2020-11-29: qty 2

## 2020-11-29 MED ORDER — SENNOSIDES-DOCUSATE SODIUM 8.6-50 MG PO TABS
2.0000 | ORAL_TABLET | Freq: Two times a day (BID) | ORAL | Status: DC
  Administered 2020-11-29 – 2020-11-30 (×3): 2 via ORAL
  Filled 2020-11-29 (×3): qty 2

## 2020-11-29 MED ORDER — SUCCINYLCHOLINE CHLORIDE 200 MG/10ML IV SOSY
PREFILLED_SYRINGE | INTRAVENOUS | Status: AC
  Filled 2020-11-29: qty 10

## 2020-11-29 MED ORDER — MENTHOL 3 MG MT LOZG
1.0000 | LOZENGE | OROMUCOSAL | Status: DC | PRN
  Filled 2020-11-29: qty 9

## 2020-11-29 MED ORDER — MIDAZOLAM HCL 2 MG/2ML IJ SOLN
INTRAMUSCULAR | Status: AC
  Filled 2020-11-29: qty 2

## 2020-11-29 SURGICAL SUPPLY — 63 items
BUR NEURO DRILL SOFT 3.0X3.8M (BURR) ×2 IMPLANT
BUR SABER DIAMOND 3.0 (BURR) ×2 IMPLANT
CANISTER SUCT 1200ML W/VALVE (MISCELLANEOUS) IMPLANT
CHLORAPREP W/TINT 26 (MISCELLANEOUS) ×4 IMPLANT
COLLAR CERV LRG MED DENS 3 (SOFTGOODS) ×2 IMPLANT
COUNTER NEEDLE 20/40 LG (NEEDLE) ×4 IMPLANT
COVER LIGHT HANDLE STERIS (MISCELLANEOUS) ×4 IMPLANT
COVER WAND RF STERILE (DRAPES) IMPLANT
CUP MEDICINE 2OZ PLAST GRAD ST (MISCELLANEOUS) ×4 IMPLANT
DERMABOND ADVANCED (GAUZE/BANDAGES/DRESSINGS) ×1
DERMABOND ADVANCED .7 DNX12 (GAUZE/BANDAGES/DRESSINGS) ×1 IMPLANT
DRAPE C-ARM 42X72 X-RAY (DRAPES) IMPLANT
DRAPE C-ARMOR (DRAPES) IMPLANT
DRAPE INCISE IOBAN 66X45 STRL (DRAPES) ×2 IMPLANT
DRAPE MICROSCOPE SPINE 48X150 (DRAPES) ×2 IMPLANT
DRAPE THYROID T SHEET (DRAPES) ×2 IMPLANT
DRSG OPSITE POSTOP 4X6 (GAUZE/BANDAGES/DRESSINGS) ×2 IMPLANT
DRSG OPSITE POSTOP 4X8 (GAUZE/BANDAGES/DRESSINGS) IMPLANT
DURAGUARD 04CMX04CM ×2 IMPLANT
DURASEAL APPLICATOR TIP (TIP) ×2 IMPLANT
DURASEAL SPINE SEALANT 3ML (MISCELLANEOUS) ×2 IMPLANT
ELECT CAUTERY BLADE TIP 2.5 (TIP) ×2
ELECTRODE CAUTERY BLDE TIP 2.5 (TIP) ×1 IMPLANT
FEE INTRAOP CADWELL SUPPLY NCS (MISCELLANEOUS) ×1 IMPLANT
FEE INTRAOP MONITOR IMPULS NCS (MISCELLANEOUS) IMPLANT
GAUZE SPONGE 4X4 12PLY STRL (GAUZE/BANDAGES/DRESSINGS) ×2 IMPLANT
GAUZE XEROFORM 1X8 LF (GAUZE/BANDAGES/DRESSINGS) ×2 IMPLANT
GLOVE SRG 8 PF TXTR STRL LF DI (GLOVE) ×1 IMPLANT
GLOVE SURG SYN 7.0 (GLOVE) IMPLANT
GLOVE SURG SYN 8.0 (GLOVE) ×4 IMPLANT
GLOVE SURG UNDER POLY LF SZ7 (GLOVE) IMPLANT
GLOVE SURG UNDER POLY LF SZ8 (GLOVE) ×1
GOWN STRL REUS W/ TWL XL LVL3 (GOWN DISPOSABLE) ×2 IMPLANT
GOWN STRL REUS W/TWL XL LVL3 (GOWN DISPOSABLE) ×2
GRADUATE 1200CC STRL 31836 (MISCELLANEOUS) ×4 IMPLANT
HEMOSTAT SURGICEL 2X3 (HEMOSTASIS) IMPLANT
INTRAOP CADWELL SUPPLY FEE NCS (MISCELLANEOUS) ×1
INTRAOP DISP SUPPLY FEE NCS (MISCELLANEOUS) ×1
INTRAOP MONITOR FEE IMPULS NCS (MISCELLANEOUS)
INTRAOP MONITOR FEE IMPULSE (MISCELLANEOUS)
IV CATH ANGIO 12GX3 LT BLUE (NEEDLE) IMPLANT
KIT TURNOVER KIT A (KITS) ×2 IMPLANT
MANIFOLD NEPTUNE II (INSTRUMENTS) ×2 IMPLANT
MARKER SKIN DUAL TIP RULER LAB (MISCELLANEOUS) ×6 IMPLANT
NEEDLE HYPO 22GX1.5 SAFETY (NEEDLE) ×2 IMPLANT
PACK LAMINECTOMY NEURO (CUSTOM PROCEDURE TRAY) ×2 IMPLANT
PAD ARMBOARD 7.5X6 YLW CONV (MISCELLANEOUS) ×4 IMPLANT
PIN MAYFIELD SKULL DISP (PIN) ×2 IMPLANT
SPOGE SURGIFLO 8M (HEMOSTASIS) ×1
SPONGE SURGIFLO 8M (HEMOSTASIS) ×1 IMPLANT
STAPLER SKIN PROX 35W (STAPLE) ×2 IMPLANT
SUT BONE WAX W31G (SUTURE) ×2 IMPLANT
SUT ETHILON 3 0 PS 1 (SUTURE) ×6 IMPLANT
SUT NURALON 4 0 TR CR/8 (SUTURE) ×2 IMPLANT
SUT POLYSORB 2-0 5X18 GS-10 (SUTURE) ×8 IMPLANT
SUT PROLENE 5 0 RB 2 (SUTURE) ×4 IMPLANT
SUT VIC AB 0 CT1 18XCR BRD 8 (SUTURE) ×3 IMPLANT
SUT VIC AB 0 CT1 8-18 (SUTURE) ×3
SYR 30ML LL (SYRINGE) ×2 IMPLANT
TAPE CLOTH 3X10 WHT NS LF (GAUZE/BANDAGES/DRESSINGS) ×2 IMPLANT
TOWEL OR 17X26 4PK STRL BLUE (TOWEL DISPOSABLE) ×4 IMPLANT
TRAY FOLEY MTR SLVR 16FR STAT (SET/KITS/TRAYS/PACK) ×2 IMPLANT
TUBING CONNECTING 10 (TUBING) ×2 IMPLANT

## 2020-11-29 NOTE — Interval H&P Note (Signed)
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  The various methods of treatment have been discussed with the patient and family. After consideration of risks, benefits and other options for treatment, the patient has consented to  Procedure(s): SUBOCCIPITAL CRANIECTOMY, C1 LAMINECTOMY, DURAPLASTY (N/A) as a surgical intervention.  The patient's history has been reviewed, patient examined, no change in status, stable for surgery.  I have reviewed the patient's chart and labs.  Questions were answered to the patient's satisfaction.     Lucy Chris

## 2020-11-29 NOTE — Plan of Care (Signed)
  Problem: Education: Goal: Ability to verbalize activity precautions or restrictions will improve Outcome: Progressing Goal: Knowledge of the prescribed therapeutic regimen will improve Outcome: Progressing Goal: Understanding of discharge needs will improve Outcome: Progressing   Problem: Activity: Goal: Ability to avoid complications of mobility impairment will improve Outcome: Progressing Goal: Ability to tolerate increased activity will improve Outcome: Progressing Goal: Will remain free from falls Outcome: Progressing   Problem: Bowel/Gastric: Goal: Gastrointestinal status for postoperative course will improve Outcome: Progressing   Problem: Clinical Measurements: Goal: Ability to maintain clinical measurements within normal limits will improve Outcome: Progressing Goal: Postoperative complications will be avoided or minimized Outcome: Progressing Goal: Diagnostic test results will improve Outcome: Progressing   Problem: Pain Management: Goal: Pain level will decrease Outcome: Progressing   Problem: Pain Management: Goal: Pain level will decrease Outcome: Progressing   Problem: Skin Integrity: Goal: Will show signs of wound healing Outcome: Progressing   Problem: Health Behavior/Discharge Planning: Goal: Identification of resources available to assist in meeting health care needs will improve Outcome: Progressing   Problem: Bladder/Genitourinary: Goal: Urinary functional status for postoperative course will improve Outcome: Progressing

## 2020-11-29 NOTE — Op Note (Signed)
Operative Note   SURGERY DATE:  11/29/2020   PRE-OP DIAGNOSIS:   Chiari I malformation   POST-OP DIAGNOSIS: Post-Op Diagnosis Codes: Chiari I malformation   Procedure(s) with comments: Suboccipital craniectomy C1 laminectomy And duraplasty   SURGEON:     * Nathaniel Man, MD       Anabel Halon, PA, Assistant   ANESTHESIA: General    OPERATIVE FINDINGS:   Indication  Mr. Compean was seen in clinic and found to have symptoms of right-sided numbness and imaging revealed a spinal cord syrinx.  This was investigated further and imaging was consistent with a Chiari I malformation as likely etiology. Given his ongoing symptoms, we discussed surgery for decompression.  We discussed risks of surgery including infection, bleeding, spinal cord/nerve root injury, vertebral artery injury, CSF leak, neck pain,  persistent symptoms/failure to improve, development of deformity, need for further surgery, and the risks of anesthesia. Patient understood these risks.  He elected to proceed with surgery for definitive treatment of her symptoms.   Procedure  The patient was brought from the preoperative center with intravenous access in place.He underwent general anesthesia and endotracheal tube intubation.  A Foley catheter was placed.  The [place] head holder was applied.The patient was then rotated onto the operative table prone where all pressure points were appropriately padded the head was then fixated to table.Clippers were then to use to remove hair off the base of the occiput. The skin was then thoroughly cleansed. A planned midline incision was established with the marking pen extending from the occiput to the spinous process ofC2. Sterile prep and drape were then applied and a timeout was then observed, perioperative antibiotic prophylaxis was administered for this procedure.   The skin was instilled with local anesthetic. The skin was opened sharply to the fascia and then taken  through the avascular plane of the muscle. The suboccipital bone and C1 lamina were exposed using cautery. Retractors were inserted. The posterior ligament was removed from foramen magnum and C1 exposing laterally. Next, a high speed drill was used to perform a 4x3 cm craniectomy incorporating the foramen magnum not exceeding the superior nuchal line. The C1 lamina was next drilled and a 1-2 cm piece of the lamina removed centrally.   The dura was opened in a Y shaped fashion extending below the C1 level. The cerebellar tonsils were descended to this level and swollen. No spontaneous pulsations were seen. There were numerous adhesions noted medially and inferiorly. The microscope was brought into the field for microdissection. The tonsils were manipulated until free from the arachnoid and adjacent tissue. All vessels were preserved. The 4th ventricle was identified and adhesions removed exposing the choroid plexus. CSF flow was seen from here. Next, both tonsils were coagulated until they were above the C1 level which allowed for CSF flow to descend to spine. Hemostasis was achieved. Next, a pericardial dural graft was used to perform duraplasty.  Prolene suture was used to primarily sew the graft. The cavity was filled with saline and after Valsalva, no leak seen. Duraseal was placed over the suture line.   The wound was irrigated with saline. The overlying muscle and fascia were closed with interrupted 0 Vicryl sutures. The galea/dermis was closed with 2-0 and 3-0 interrupted Vicryl sutures. The skin above was closed with 3-0 Nylon. The wound was dressed with Bacitracin ointment, Xeroform, and gauze with adhesive dressing   The patient had general anesthesia reversed and was extubated following the procedure. The patient awoke following  commands with symmetric movement and no focal cranial nerve deficits and taken to the PACU where he continued recovery. Following the procedure, I spoke with the patient's  family about the procedure and answered all questions.       ESTIMATED BLOOD LOSS:   75 cc   SPECIMENS None   IMPLANT DURAGUARD [id number] - [id number]  Inventory Item: DURAGUARD 04CMX04CM Serial no.: [id number] Model/Cat no.: [id number]  Implant name: Margaretha Sheffield [id number] - [id number] Laterality: N/A Area: Cranial  Manufacturer: [employer] Date of Manufacture:    Action: Implanted Number Used: 1   Device Identifier:  Device Identifier Type:          I performed the case in its entirety with assistance of PA, [facility]   Lucy Chris, MD [phone]

## 2020-11-29 NOTE — Transfer of Care (Signed)
Immediate Anesthesia Transfer of Care Note  Patient: Sean Oneill  Procedure(s) Performed: SUBOCCIPITAL CRANIECTOMY, C1 LAMINECTOMY, DURAPLASTY (N/A )  Patient Location: PACU  Anesthesia Type:General  Level of Consciousness: awake and alert   Airway & Oxygen Therapy: Patient Spontanous Breathing and Patient connected to nasal cannula oxygen  Post-op Assessment: Report given to RN and Post -op Vital signs reviewed and stable  Post vital signs: Reviewed and stable  Last Vitals:  Vitals Value Taken Time  BP    Temp    Pulse    Resp    SpO2      Last Pain:  Vitals:   11/29/20 0911  TempSrc: Temporal  PainSc: 2          Complications: No complications documented.

## 2020-11-29 NOTE — Anesthesia Postprocedure Evaluation (Signed)
Anesthesia Post Note  Patient: Sean Oneill  Procedure(s) Performed: SUBOCCIPITAL CRANIECTOMY, C1 LAMINECTOMY, DURAPLASTY (N/A )  Patient location during evaluation: PACU Anesthesia Type: General Level of consciousness: awake and alert and oriented Pain management: pain level controlled Vital Signs Assessment: post-procedure vital signs reviewed and stable Respiratory status: spontaneous breathing, nonlabored ventilation and respiratory function stable Cardiovascular status: blood pressure returned to baseline and stable Postop Assessment: no signs of nausea or vomiting Anesthetic complications: no   No complications documented.   Last Vitals:  Vitals:   11/29/20 1515 11/29/20 1530  BP: (!) 158/90 (!) 157/95  Pulse: 80 77  Resp: 18 20  Temp: (!) 36.4 C (!) 36.4 C  SpO2: 100% 100%    Last Pain:  Vitals:   11/29/20 1530  TempSrc:   PainSc: 1                  Jacquelin Krajewski

## 2020-11-29 NOTE — Progress Notes (Signed)
Patient awake/alert x4. Able to move all ext without event . Occipital area with dressing c/d/i  x2 sites side of head with x1 staple: R side Suture L side, both covered with bacitracin oint. Paitnet c/o of tongue feeling "numb"  Able to swallow, tolerates sips of water. Dr. Randa Ngo at bedside examined patient:  No resp distress noted, patient denies pain NO nausea.  HOB 30 degree's    OK to transfer to room 158.

## 2020-11-29 NOTE — Anesthesia Preprocedure Evaluation (Signed)
Anesthesia Evaluation  Patient identified by MRN, date of birth, ID band Patient awake    Reviewed: Allergy & Precautions, NPO status , Patient's Chart, lab work & pertinent test results  History of Anesthesia Complications Negative for: history of anesthetic complications  Airway Mallampati: II  TM Distance: >3 FB Neck ROM: Full    Dental no notable dental hx.    Pulmonary neg pulmonary ROS, neg sleep apnea, neg COPD,    breath sounds clear to auscultation- rhonchi (-) wheezing      Cardiovascular Exercise Tolerance: Good hypertension, Pt. on medications (-) angina(-) CAD, (-) Past MI and (-) Cardiac Stents  Rhythm:Regular Rate:Normal - Systolic murmurs and - Diastolic murmurs    Neuro/Psych neg Seizures chiari malformation negative psych ROS   GI/Hepatic Neg liver ROS, GERD  ,  Endo/Other  negative endocrine ROSneg diabetes  Renal/GU negative Renal ROS     Musculoskeletal negative musculoskeletal ROS (+)   Abdominal (+) + obese,   Peds  Hematology negative hematology ROS (+)   Anesthesia Other Findings Past Medical History: No date: Chiari malformation type I (HCC) No date: GERD (gastroesophageal reflux disease)     Comment:  OCC NO MEDS No date: History of kidney stones     Comment:  H/O No date: Hypertension   Reproductive/Obstetrics                             Anesthesia Physical Anesthesia Plan  ASA: II  Anesthesia Plan: General   Post-op Pain Management:    Induction: Intravenous  PONV Risk Score and Plan: 1 and Ondansetron, Dexamethasone and Midazolam  Airway Management Planned: Oral ETT  Additional Equipment:   Intra-op Plan:   Post-operative Plan: Extubation in OR  Informed Consent: I have reviewed the patients History and Physical, chart, labs and discussed the procedure including the risks, benefits and alternatives for the proposed anesthesia with the  patient or authorized representative who has indicated his/her understanding and acceptance.     Dental advisory given  Plan Discussed with: CRNA and Anesthesiologist  Anesthesia Plan Comments:         Anesthesia Quick Evaluation

## 2020-11-29 NOTE — H&P (Signed)
Sean Oneill is an 35 y.o. male.   Chief Complaint: Right side numbness HPI: Sean Oneill is here for evaluation of a spinal cord syrinx that was recently found after evaluation for right-sided numbness. He states that the numbness in his right arm and leg is persistent and has been going on for years. He does not note any obvious pain with this but the whole arm and leg have decreased sensation. He does notice that some of his fingers on his left arm have started to have similar sensations. He is ambidextrous but has noted some difficulty with more fine motor movements recently. He also endorses some balance difficulty. He denies any bowel or bladder changes. He does not endorse any severe headaches but he does have severe neck pain. 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 We discussed Chiari decompression to aid in treatment.   Past Medical History:  Diagnosis Date  . Chiari malformation type I (HCC)   . GERD (gastroesophageal reflux disease)    OCC NO MEDS  . History of kidney stones    H/O  . Hypertension     Past Surgical History:  Procedure Laterality Date  . NO PAST SURGERIES      No family history on file. Social History:  reports that he has never smoked. He has never used smokeless tobacco. He reports current alcohol use. He reports current drug use. Drug: Marijuana.  Allergies: No Known Allergies  Medications Prior to Admission  Medication Sig Dispense Refill  . baclofen (LIORESAL) 10 MG tablet Take 1 tablet (10 mg total) by mouth 3 (three) times daily. (Patient taking differently: Take 10 mg by mouth 3 (three) times daily. WILL BE FINISHED WITH THIS PRIOR TO 11-29-20 SURGERY) 30 each 0  . cholecalciferol (VITAMIN D3) 25 MCG (1000 UNIT) tablet Take 1,000 Units by mouth daily. STATES HE TAKES OCCASIONALLY NOT EVERY DAY    . losartan (COZAAR) 100 MG tablet Take 100 mg by mouth every morning.    . naproxen (NAPROSYN) 500 MG tablet Take 1 tablet (500  mg total) by mouth 2 (two) times daily with a meal. (Patient taking differently: Take 500 mg by mouth as needed.) 30 tablet 0  . predniSONE (STERAPRED UNI-PAK 21 TAB) 10 MG (21) TBPK tablet Use as directed (Patient taking differently: Use as directed-WILL BE FINISHED WITH THIS PRIOR TO 11-29-20 SURGERY) 21 tablet 0    Results for orders placed or performed during the hospital encounter of 11/29/20 (from the past 48 hour(s))  Urine Drug Screen, Qualitative ([facility] only)     Status: None   Collection Time: 11/29/20  8:59 AM  Result Value Ref Range   Tricyclic, Ur Screen NONE DETECTED NONE DETECTED   Amphetamines, Ur Screen NONE DETECTED NONE DETECTED   MDMA (Ecstasy)Ur Screen NONE DETECTED NONE DETECTED   Cocaine Metabolite,Ur [place] NONE DETECTED NONE DETECTED   Opiate, Ur Screen NONE DETECTED NONE DETECTED   Phencyclidine (PCP) Ur S NONE DETECTED NONE DETECTED   Cannabinoid 50 Ng, Ur [place] NONE DETECTED NONE DETECTED   Barbiturates, Ur Screen NONE DETECTED NONE DETECTED   Benzodiazepine, Ur Scrn NONE DETECTED NONE DETECTED   Methadone Scn, Ur NONE DETECTED NONE DETECTED    Comment: (NOTE) Tricyclics + metabolites, urine    Cutoff 1000 ng/mL Amphetamines + metabolites, urine  Cutoff 1000 ng/mL MDMA (Ecstasy), urine              Cutoff 500 ng/mL Cocaine Metabolite, urine  Cutoff 300 ng/mL Opiate + metabolites, urine        Cutoff 300 ng/mL Phencyclidine (PCP), urine         Cutoff 25 ng/mL Cannabinoid, urine                 Cutoff 50 ng/mL Barbiturates + metabolites, urine  Cutoff 200 ng/mL Benzodiazepine, urine              Cutoff 200 ng/mL Methadone, urine                   Cutoff 300 ng/mL  The urine drug screen provides only a preliminary, unconfirmed analytical test result and should not be used for non-medical purposes. Clinical consideration and professional judgment should be applied to any positive drug screen result due to possible interfering substances. A more specific  alternate chemical method must be used in order to obtain a confirmed analytical result. Gas chromatography / mass spectrometry (GC/MS) is the preferred confirm atory method. Performed at [facility], [address] [place], [place] [address]    No results found.  Review of Systems General ROS: Negative Psychological ROS: Negative Ophthalmic ROS: Negative ENT ROS: Negative Hematological and Lymphatic ROS: Negative  Endocrine ROS: Negative Respiratory ROS: Negative Cardiovascular ROS: Negative Gastrointestinal ROS: Negative Genito-Urinary ROS: Negative Musculoskeletal ROS: Positive for neck pain Neurological ROS: Positive for numbness Dermatological ROS: Negative  Blood pressure (!) 169/103, pulse 84, temperature 98 F (36.7 C), temperature source Temporal, resp. rate 18, height 5\' 8"  (1.727 m), weight 127 kg, SpO2 100 %. Physical Exam  General appearance: Alert, cooperative, in no acute distress Head: Normocephalic, atraumatic Eyes: Normal, EOM intact Oropharynx: Moist without lesions CV: Regular rate and rhythm Pulm: Clear to auscultation Neck: Supple, range of motion appears full, no tenderness to palpation Ext: No edema in LE bilaterally  Neurologic exam:  Mental status: alertness: alert, affect: normal Speech: fluent and clear Motor:strength symmetric 5/5 in bilateral upper and lower extremities Sensory: Decreased light touch throughout the right arm and right leg in all dermatomes Reflexes: 2+ and symmetric bilaterally for biceps, 3+ at bilateral patella, positive Hoffman's bilaterally Gait: normal   Imaging: MRI cervical spine: There are low-lying tonsils of approximately 14 mm below the foramen magnum. They do descend down to the level of the C1 lamina. There is a syrinx that starts at approximately the C to level and extends to the upper thoracic level but is incompletely visualized at the bottom. This does seem maximum in the lower cervical spine  where it encompasses greater than two thirds of the spinal cord diameter. There is T1 hypointense and T2 hyperintense fluid without any obvious enhancement.   Assessment/Plan Proceed with suboccipital craniectomy and duraplasty for Chiari decompression  , MD 11/29/2020, 9:53 AM

## 2020-11-29 NOTE — Anesthesia Procedure Notes (Signed)
Procedure Name: Intubation Performed by: Berniece Pap, CRNA Pre-anesthesia Checklist: Patient identified, Emergency Drugs available, Suction available and Patient being monitored Patient Re-evaluated:Patient Re-evaluated prior to induction Oxygen Delivery Method: Circle system utilized Preoxygenation: Pre-oxygenation with 100% oxygen Induction Type: IV induction Ventilation: Mask ventilation without difficulty Laryngoscope Size: McGraph and 4 Grade View: Grade I Tube type: Oral Tube size: 7.5 mm Number of attempts: 1 Airway Equipment and Method: Stylet and Oral airway Placement Confirmation: ETT inserted through vocal cords under direct vision,  positive ETCO2 and breath sounds checked- equal and bilateral Secured at: 22 cm Tube secured with: Tape Dental Injury: Teeth and Oropharynx as per pre-operative assessment

## 2020-11-29 NOTE — Progress Notes (Signed)
Pharmacy Antibiotic Note  Sean Oneill is a 35 y.o. male admitted on (Not on file) with surgical prophylaxis.  Pharmacy has been consulted for Cefazolin dosing.  Plan: Cefazolin 3 gm IV X 1 60 min pre-op ordered for 5/16 @ 0500.      No data recorded.  No results for input(s): WBC, CREATININE, LATICACIDVEN, VANCOTROUGH, VANCOPEAK, VANCORANDOM, GENTTROUGH, GENTPEAK, GENTRANDOM, TOBRATROUGH, TOBRAPEAK, TOBRARND, AMIKACINPEAK, AMIKACINTROU, AMIKACIN in the last 168 hours.  Estimated Creatinine Clearance: 104.8 mL/min (A) (by C-G formula based on SCr of 1.31 mg/dL (H)).    No Known Allergies  Antimicrobials this admission:   >>    >>   Dose adjustments this admission:   Microbiology results:  BCx:   UCx:    Sputum:    MRSA PCR:   Thank you for allowing pharmacy to be a part of this patient's care.  Geselle Cardosa D 11/29/2020 12:36 AM

## 2020-11-30 ENCOUNTER — Encounter: Payer: Self-pay | Admitting: Neurosurgery

## 2020-11-30 LAB — GLUCOSE, CAPILLARY: Glucose-Capillary: 92 mg/dL (ref 70–99)

## 2020-11-30 MED ORDER — HYDRALAZINE HCL 20 MG/ML IJ SOLN: 5.0000 mg | Freq: Once | INTRAMUSCULAR | Status: DC

## 2020-11-30 MED ORDER — ENOXAPARIN SODIUM 40 MG/0.4ML IJ SOSY
40.0000 mg | PREFILLED_SYRINGE | INTRAMUSCULAR | Status: DC
  Administered 2020-11-30: 40 mg via SUBCUTANEOUS
  Filled 2020-11-30: qty 0.4

## 2020-11-30 MED ORDER — HYDRALAZINE HCL 20 MG/ML IJ SOLN
2.0000 mg | Freq: Once | INTRAMUSCULAR | Status: DC
  Filled 2020-11-30: qty 1

## 2020-11-30 NOTE — Progress Notes (Signed)
   Progress Note   Date: 11/30/2020  Surgery: Suboccipital Craniectomy, C1 laminectomy, Duraplasty  HPI: [name] is here for evaluation of a spinal cord syrinx that was recently found after evaluation for right-sided numbness. He states that the numbness in his right arm and leg is persistent and has been going on for years. He does not note any obvious pain with this but the whole arm and leg have decreased sensation. He does notice that some of his fingers on his left arm have started to have similar sensations. He is ambidextrous but has noted some difficulty with more fine motor movements recently. He also endorses some balance difficulty. He denies any bowel or bladder changes. He does not endorse any severe headaches but he does have severe neck pain. 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 We discussed Chiari decompression to aid in treatment.  Subjective: POD#1 - Patient has some tongue numbness and we gave steroids for concern for swelling from ET tube. He has some neck discomfort but this is not severe. He feels numbness in his foot is improved. He denies any new symptoms. He is tolerating liquids.      Vital Signs: Temp:  [97.4 F (36.3 C)-98.9 F (37.2 C)] 98.1 F (36.7 C) (05/17 0747) Pulse Rate:  [73-89] 85 (05/17 0747) Resp:  [14-20] 18 (05/17 0747) BP: (115-176)/(77-111) 169/97 (05/17 0747) SpO2:  [97 %-100 %] 97 % (05/17 0747) Weight:  [287 kg] 127 kg (05/16 0950) Temp (24hrs), Avg:98.1 F (36.7 C), Min:97.4 F (36.3 C), Max:98.9 F (37.2 C)  Weight: 127 kg   Problem List Patient Active Problem List   Diagnosis Date Noted  . Chiari I malformation (HCC) 11/29/2020    Medications: Scheduled Meds: . baclofen  10 mg Oral TID  . cholecalciferol  1,000 Units Oral Daily  . enoxaparin (LOVENOX) injection  40 mg Subcutaneous Q24H  . hydrALAZINE  2 mg Intravenous Once  . ketorolac  7.5 mg Intravenous Q6H  . losartan  100 mg Oral  BH-q7a  . senna-docusate  2 tablet Oral BID  . sodium chloride flush  3 mL Intravenous Q12H   Continuous Infusions: . sodium chloride 250 mL (11/29/20 1755)   PRN Meds:.acetaminophen **OR** acetaminophen, cyclobenzaprine, HYDROmorphone (DILAUDID) injection, menthol-cetylpyridinium **OR** phenol, ondansetron **OR** ondansetron (ZOFRAN) IV, oxyCODONE, oxyCODONE, sodium chloride flush  Labs:  Lab Results  Component Value Date   WBC 15.4 (H) 11/19/2020   WBC 12.2 (H) 05/28/2020   HCT 40.8 11/19/2020   HCT 42.2 05/28/2020   PLT 328 11/19/2020   PLT 288 05/28/2020    Lab Results  Component Value Date   INR 1.0 11/19/2020   APTT 29 11/19/2020   Lab Results  Component Value Date   NA 141 11/19/2020   NA 140 05/28/2020   K 3.6 11/19/2020   K 3.6 05/28/2020   BUN 21 (H) 11/19/2020   BUN 13 05/28/2020   No results found for: MG  Exam:  Awake, alert EOMs intact Face is symmetric Tongue is symmetric  5/5 strength in extremities Decreased sensation on right side  Dressing mildly saturated, changed   A/P: Mr Sean Oneill is now post op from his Chiari decompression.  - Ambulate, Ok for PT/OT - DVT prophylaxis - Advance diet - Pain control with oral regimen   Lucy Chris, MD [phone]

## 2020-11-30 NOTE — Evaluation (Addendum)
Physical Therapy Evaluation Patient Details Name: Sean Oneill MRN: [id number] DOB: Jul 14, 1986 Today's Date: 11/30/2020   History of Present Illness  admitted for acute hospitalization status post suboccipital craniotomy and duraplasty with cranial decompression (11/29/20).  Clinical Impression  Patient resting in bed upon arrival to session; eager for OOB efforts.  Alert and oriented to basic information; follows commands and participates well with mobility tasks.  Reports pain well-controlled and does endorse noted improvement in function/sensory awareness since surgery.  Bilat UE/LE Strength and ROM grossly 4+ to 5/5 throughout; no focal weakness appreciated.  Does endorse mild paresthesia over R C6 and R S1 dermatomes (though improved from baseline).  Able to complete bed mobility indep; sit/stand, basic transfers and gait (50') without assist device, sup/mod indep.  Demonstrates reciprocal stepping pattern with good LE step height/length, good trunk rotation; good stability and safety awareness. Negotiates turns, obstacles without difficulty; good adherence/awareness to cervical precautions.  Distance limited until appropriate cervical brace arrives; will continue to assess/progress as appropriate. Would benefit from skilled PT to address above deficits and promote optimal return to PLOF.; recommend discharge home with follow up as recommended by surgeon upon discharge.    Follow Up Recommendations Follow surgeon's recommendation for DC plan and follow-up therapies    Equipment Recommendations       Recommendations for Other Services       Precautions / Restrictions Precautions Precautions: Cervical Required Braces or Orthoses: Cervical Brace Cervical Brace: Soft collar (okay to remove in bed, for bathroom privileges and with showering) Restrictions Weight Bearing Restrictions: No      Mobility  Bed Mobility Overal bed mobility: Independent                   Transfers Overall transfer level: Modified independent Equipment used: None             General transfer comment: good LE strength/stability  Ambulation/Gait Ambulation/Gait assistance: Supervision Gait Distance (Feet): 50 Feet Assistive device: None       General Gait Details: reciprocal stepping pattern with good LE step height/length, good trunk rotation; good stability and safety awareness. Negotiates turns, obstacles without difficulty; good adherence/awareness to cervical precautions.  Stairs            Wheelchair Mobility    Modified Rankin (Stroke Patients Only)       Balance Overall balance assessment: Modified Independent Sitting-balance support: No upper extremity supported;Feet supported Sitting balance-Leahy Scale: Normal     Standing balance support: No upper extremity supported Standing balance-Leahy Scale: Good                               Pertinent Vitals/Pain Pain Assessment: No/denies pain    Home Living Family/patient expects to be discharged to:: Private residence Living Arrangements: Parent Available Help at Discharge: Family Type of Home: House Home Access: Stairs to enter Entrance Stairs-Rails: None Secretary/administrator of Steps: 3 Home Layout: One level Home Equipment: None Additional Comments: Patient lives at home with girlfriend; however, planning to discharge home to [facility] house from hospital (layout described above is mother's)    Prior Function Level of Independence: Independent         Comments: Indep with ADLs, household and community mobilization without device; denies fall history.  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     Hand Dominance   Dominant Hand: Right    Extremity/Trunk Assessment   Upper Extremity  Assessment Upper Extremity Assessment: Overall WFL for tasks assessed (bilat UEs grossly 4+ to 5/5 throughout; mild  paresthesia over R C6 dermatome (improved s/p surgery))    Lower Extremity Assessment Lower Extremity Assessment: Overall WFL for tasks assessed (bilat LEs grossly 4+ to 5/5 throughout; mild paresthesia R S1 dermatome)       Communication   Communication: No difficulties  Cognition Arousal/Alertness: Awake/alert Behavior During Therapy: WFL for tasks assessed/performed Overall Cognitive Status: Within Functional Limits for tasks assessed                                        General Comments      Exercises Other Exercises Other Exercises: Educated in role of PT and progressive mobility; reviewed cervical precautions and use of soft cervical collar.  Patient voiced understanding of all information. Other Exercises: Soft collar (large) noted in room does not fit patient; working with OR/PACU to determine availability of larger brace.  Will issue/apply (and progress mobility distance) once received  Lower body dressing, seated edge of bed, set up/sup; good awareness of precautions.  Good movement/control of bilat UE/LEs with functional task.  Sit/stand to pull pants over hips, mod indep. 5x sit/stand without assist device, 13 seconds; good LE strength, power and control.   Assessment/Plan    PT Assessment Patient needs continued PT services  PT Problem List Impaired sensation;Decreased mobility       PT Treatment Interventions DME instruction;Gait training;Stair training;Functional mobility training;Therapeutic activities;Therapeutic exercise;Balance training;Patient/family education    PT Goals (Current goals can be found in the Care Plan section)  Acute Rehab PT Goals Patient Stated Goal: to go home PT Goal Formulation: With patient Time For Goal Achievement: 12/14/20 Potential to Achieve Goals: Good    Frequency 7X/week   Barriers to discharge        Co-evaluation               AM-PAC PT "6 Clicks" Mobility  Outcome Measure Help needed  turning from your back to your side while in a flat bed without using bedrails?: None Help needed moving from lying on your back to sitting on the side of a flat bed without using bedrails?: None Help needed moving to and from a bed to a chair (including a wheelchair)?: None Help needed standing up from a chair using your arms (e.g., wheelchair or bedside chair)?: None Help needed to walk in hospital room?: None Help needed climbing 3-5 steps with a railing? : None 6 Click Score: 24    End of Session   Activity Tolerance: Patient tolerated treatment well Patient left: in chair;with call bell/phone within reach Nurse Communication: Mobility status PT Visit Diagnosis: Muscle weakness (generalized) (M62.81);Difficulty in walking, not elsewhere classified (R26.2)    Time: 9983-3825 PT Time Calculation (min) (ACUTE ONLY): 20 min   Charges:   PT Evaluation $PT Eval Moderate Complexity: 1 Mod PT Treatments $Therapeutic Activity: 8-22 mins       Iniya Matzek H. Manson Passey, PT, DPT, NCS 11/30/20, 10:14 AM [phone]

## 2020-11-30 NOTE — Progress Notes (Signed)
OT Cancellation Note  Patient Details Name: Sean Oneill MRN: [id number] DOB: 1986-05-08   Cancelled Treatment:    Reason Eval/Treat Not Completed: OT screened, no needs identified, will sign off. Per PT, pt performing self care independently and does not need OT intervention at this time. OT to complete orders.  Jackquline Denmark, MS, OTR/L , CBIS ascom [phone]  11/30/20, 10:21 AM    11/30/2020, 10:20 AM

## 2020-12-01 MED ORDER — OXYCODONE HCL 5 MG PO TABS: 5.0000 mg | ORAL_TABLET | ORAL | 0 refills | Status: DC | PRN

## 2020-12-01 MED ORDER — ACETAMINOPHEN 325 MG PO TABS: 650.0000 mg | ORAL_TABLET | ORAL | Status: DC | PRN

## 2020-12-01 NOTE — Discharge Instructions (Signed)
Your surgeon has performed an operation on your neck and skull. Many times, patients feel better immediately after surgery and can "overdo it." Even if you feel well, it is important that you follow these activity guidelines. If you do not let your neck properly from the surgery, you can increase the chance of an issue.  The following are instructions to help in your recovery once you have been discharged from the hospital.  * Do not take anti-inflammatory medications for 4 weeks after surgery (naproxen [Aleve], ibuprofen [Advil, Motrin], celecoxib [Celebrex], etc.)  Activity    No bending, lifting, or twisting ("BLT"). Avoid lifting objects heavier than 10 pounds (gallon milk jug).  Where possible, avoid household activities that involve lifting, bending, pushing, or pulling such as laundry, vacuuming, grocery shopping, and childcare. Try to arrange for help from friends and family for these activities while your back heals.  Increase physical activity slowly as tolerated.  Taking short walks is encouraged, but avoid strenuous exercise. Do not jog, run, bicycle, lift weights, or participate in any other exercises unless specifically allowed by your doctor. Avoid prolonged sitting, including car rides.  Talk to your doctor before resuming sexual activity.  You should not drive until cleared by your doctor.  Until released by your doctor, you should not return to work or school.  You should rest at home and let your body heal.   You may shower 4 days after your surgery.  After showering, lightly dab your incision dry. Do not take a tub bath or go swimming for 3 weeks, or until approved by your doctor at your follow-up appointment.  If you smoke, we strongly recommend that you quit.  Smoking has been proven to interfere with normal healing in your back and will dramatically reduce the success rate of your surgery. Please contact [facility] (800-QUIT-NOW) and use the resources at www.[facility].com  for assistance in stopping smoking.  Surgical Incision   If you have a dressing on your incision, you may remove it three days after your surgery (Thursday 5/19). Keep your incision area clean and dry.  If you have staples or stitches on your incision, you should have a follow up scheduled for removal. If you do not have staples or stitches, you will have steri-strips (small pieces of surgical tape) or Dermabond glue. The steri-strips/glue should begin to peel away within about a week (it is fine if the steri-strips fall off before then). If the strips are still in place one week after your surgery, you may gently remove them.  Diet            You may return to your usual diet. Be sure to stay hydrated.  When to Contact us  Although your surgery and recovery will likely be uneventful, you may have some residual numbness, aches, and pains in your back and/or legs. This is normal and should improve in the next few weeks.  However, should you experience any of the following, contact us immediately: . New numbness or weakness . Pain that is progressively getting worse, and is not relieved by your pain medications or rest . Bleeding, redness, swelling, pain, or drainage from surgical incision . Chills or flu-like symptoms . Fever greater than 101.0 F (38.3 C) . Problems with bowel or bladder functions . Difficulty breathing or shortness of breath . Warmth, tenderness, or swelling in your calf  Contact Information . During office hours (Monday-Friday 9 am to 5 pm), please call your physician at [phone] . After  hours and weekends, please call [phone] and speak with the answering service, who will contact the doctor on call.  If that fails, call the [facility] Operator at [phone] and ask for the Neurosurgery Resident On Call  . For a life-threatening emergency, call 911

## 2020-12-01 NOTE — Progress Notes (Signed)
   Progress Note   Date: 12/01/2020  Surgery: Suboccipital Craniectomy, C1 laminectomy, Duraplasty  HPI: [name] is here for evaluation of a spinal cord syrinx that was recently found after evaluation for right-sided numbness. He states that the numbness in his right arm and leg is persistent and has been going on for years. He does not note any obvious pain with this but the whole arm and leg have decreased sensation. He does notice that some of his fingers on his left arm have started to have similar sensations. He is ambidextrous but has noted some difficulty with more fine motor movements recently. He also endorses some balance difficulty. He denies any bowel or bladder changes. He does not endorse any severe headaches but he does have severe neck pain. 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 We discussed Chiari decompression to aid in treatment.  Subjective: POD#2 - Patient has some neck pain.  Otherwise doing well, tolerating PO and moving around well.    POD#1 - Patient has some tongue numbness and we gave steroids for concern for swelling from ET tube. He has some neck discomfort but this is not severe. He feels numbness in his foot is improved. He denies any new symptoms. He is tolerating liquids.      Vital Signs: Temp:  [98.1 F (36.7 C)-99.8 F (37.7 C)] 99 F (37.2 C) (05/18 0740) Pulse Rate:  [67-85] 78 (05/18 0740) Resp:  [17-18] 18 (05/18 0740) BP: (151-174)/(87-100) 151/87 (05/18 0740) SpO2:  [94 %-99 %] 97 % (05/18 0740) Temp (24hrs), Avg:99.1 F (37.3 C), Min:98.1 F (36.7 C), Max:99.8 F (37.7 C)  Weight: 127 kg   Problem List Patient Active Problem List   Diagnosis Date Noted  . Chiari I malformation (HCC) 11/29/2020    Medications: Scheduled Meds: . baclofen  10 mg Oral TID  . cholecalciferol  1,000 Units Oral Daily  . enoxaparin (LOVENOX) injection  40 mg Subcutaneous Q24H  . hydrALAZINE  2 mg Intravenous Once  .  losartan  100 mg Oral BH-q7a  . senna-docusate  2 tablet Oral BID  . sodium chloride flush  3 mL Intravenous Q12H   Continuous Infusions: . sodium chloride 250 mL (11/29/20 1755)   PRN Meds:.acetaminophen **OR** acetaminophen, cyclobenzaprine, HYDROmorphone (DILAUDID) injection, menthol-cetylpyridinium **OR** phenol, ondansetron **OR** ondansetron (ZOFRAN) IV, oxyCODONE, oxyCODONE, sodium chloride flush  Labs:  Lab Results  Component Value Date   WBC 15.4 (H) 11/19/2020   WBC 12.2 (H) 05/28/2020   HCT 40.8 11/19/2020   HCT 42.2 05/28/2020   PLT 328 11/19/2020   PLT 288 05/28/2020    Lab Results  Component Value Date   INR 1.0 11/19/2020   APTT 29 11/19/2020    Lab Results  Component Value Date   NA 141 11/19/2020   NA 140 05/28/2020   K 3.6 11/19/2020   K 3.6 05/28/2020   BUN 21 (H) 11/19/2020   BUN 13 05/28/2020   No results found for: MG  Exam:  Awake, alert EOMs intact Face is symmetric Tongue is symmetric  5/5 strength in extremities  Dressing clean  A/P: Mr Secrist is now post op from his Chiari decompression.  - Ambulate - DVT prophylaxis - Advance diet - Pain control with oral regimen   Venetia Night, MD

## 2020-12-01 NOTE — Discharge Summary (Signed)
Physician Discharge Summary  Patient ID: Sean Oneill MRN: [id number] DOB/AGE: [birth year] 35 y.o.  Admit date: 11/29/2020 Discharge date: 12/01/2020  Admission Diagnoses: Chiari malformation  Discharge Diagnoses:  Active Problems:   Chiari I malformation [facility])   Discharged Condition: good  Hospital Course: [name] is a 35 yo male who presented with chiari malformation.  He underwent a decompression, and was admitted for post-operative care.  He did well and was cleared by PT.  Once his pain needs were met, he was felt to be stable for discharge.  Consults: None  Significant Diagnostic Studies: none  Treatments: surgery: Chiari malformation  Discharge Exam: Blood pressure (!) 151/87, pulse 78, temperature 99 F (37.2 C), temperature source Oral, resp. rate 18, height '5\' 8"'  (1.727 m), weight 127 kg, SpO2 97 %. General appearance: alert and cooperative  CNI MAEW 5/5  Disposition: Discharge disposition: 01-Home or Self Care       Discharge Instructions    Discharge patient   Complete by: As directed    Discharge disposition: 01-Home or Self Care   Discharge patient date: 12/01/2020   Incentive spirometry RT   Complete by: As directed      Allergies as of 12/01/2020   No Known Allergies     Medication List    STOP taking these medications   naproxen 500 MG tablet Commonly known as: Naprosyn   predniSONE 10 MG (21) Tbpk tablet Commonly known as: STERAPRED UNI-PAK 21 TAB     TAKE these medications   acetaminophen 325 MG tablet Commonly known as: TYLENOL Take 2 tablets (650 mg total) by mouth every 4 (four) hours as needed for mild pain ((score 1 to 3) or temp > 100.5).   baclofen 10 MG tablet Commonly known as: LIORESAL Take 1 tablet (10 mg total) by mouth 3 (three) times daily. What changed: additional instructions   cholecalciferol 25 MCG (1000 UNIT) tablet Commonly known as: VITAMIN D3 Take 1,000 Units by mouth daily. STATES HE TAKES OCCASIONALLY  NOT EVERY DAY   losartan 100 MG tablet Commonly known as: COZAAR Take 100 mg by mouth every morning.   oxyCODONE 5 MG immediate release tablet Commonly known as: Oxy IR/ROXICODONE Take 1 tablet (5 mg total) by mouth every 4 (four) hours as needed for moderate pain ((score 4 to 6)).       Follow-up Information    Deetta Perla, MD Follow up in 2 week(s).   Specialty: Neurosurgery Why: as scheduled Contact information: [place] [address] [phone]               Signed: Meade Maw 12/01/2020, 7:53 AM

## 2020-12-01 NOTE — Progress Notes (Addendum)
Physical Therapy Treatment Patient Details Name: Sean Oneill MRN: [id number] DOB: 07/21/1985 Today's Date: 12/01/2020    History of Present Illness admitted for acute hospitalization status post suboccipital craniotomy and duraplasty with cranial decompression (11/29/20).    PT Comments    Pt was asleep in side lying upon arriving. He agrees to PT session and is cooperative and pleasant. Eager to DC home later today. Easily able to exit bed, stand and ambulate without AD. Performed ascending/descending stairs without difficulty. Pt will be staying at his moms house at DC until recovered fully. Cleared from PT standpoint for safe DC home.    Follow Up Recommendations  Follow surgeon's recommendation for DC plan and follow-up therapies     Equipment Recommendations  None recommended by PT       Precautions / Restrictions Precautions Precautions: Cervical Required Braces or Orthoses: Cervical Brace;Other Brace (cleared to DC without brace per surgeon)    Mobility  Bed Mobility Overal bed mobility: Independent     Transfers Overall transfer level: Modified independent       Ambulation/Gait Ambulation/Gait assistance: Supervision Gait Distance (Feet): 200 Feet Assistive device: None  Balance Overall balance assessment: Modified Independent Sitting-balance support: No upper extremity supported;Feet supported Sitting balance-Leahy Scale: Normal       Cognition Arousal/Alertness: Awake/alert Behavior During Therapy: WFL for tasks assessed/performed Overall Cognitive Status: Within Functional Limits for tasks assessed      General Comments: Pt is A and O x 4             Pertinent Vitals/Pain Pain Assessment: No/denies pain           PT Goals (current goals can now be found in the care plan section) Acute Rehab PT Goals Patient Stated Goal: to go home Progress towards PT goals: Progressing toward goals    Frequency    7X/week      PT Plan Current  plan remains appropriate       AM-PAC PT "6 Clicks" Mobility   Outcome Measure  Help needed turning from your back to your side while in a flat bed without using bedrails?: None Help needed moving from lying on your back to sitting on the side of a flat bed without using bedrails?: None Help needed moving to and from a bed to a chair (including a wheelchair)?: None Help needed standing up from a chair using your arms (e.g., wheelchair or bedside chair)?: None Help needed to walk in hospital room?: None Help needed climbing 3-5 steps with a railing? : None 6 Click Score: 24    End of Session Equipment Utilized During Treatment: Gait belt Activity Tolerance: Patient tolerated treatment well Patient left: in chair;with call bell/phone within reach Nurse Communication: Mobility status PT Visit Diagnosis: Muscle weakness (generalized) (M62.81);Difficulty in walking, not elsewhere classified (R26.2)     Time: 1005-1020 PT Time Calculation (min) (ACUTE ONLY): 15 min  Charges:  $Gait Training: 8-22 mins                     Jetta Lout PTA 12/01/20, 10:36 AM

## 2020-12-01 NOTE — Plan of Care (Signed)
  Problem: Education: Goal: Ability to verbalize activity precautions or restrictions will improve Outcome: Adequate for Discharge Goal: Knowledge of the prescribed therapeutic regimen will improve Outcome: Adequate for Discharge Goal: Understanding of discharge needs will improve Outcome: Adequate for Discharge   Problem: Activity: Goal: Ability to avoid complications of mobility impairment will improve Outcome: Adequate for Discharge Goal: Ability to tolerate increased activity will improve Outcome: Adequate for Discharge Goal: Will remain free from falls Outcome: Adequate for Discharge   Problem: Bowel/Gastric: Goal: Gastrointestinal status for postoperative course will improve Outcome: Adequate for Discharge   Problem: Clinical Measurements: Goal: Ability to maintain clinical measurements within normal limits will improve Outcome: Adequate for Discharge Goal: Postoperative complications will be avoided or minimized Outcome: Adequate for Discharge Goal: Diagnostic test results will improve Outcome: Adequate for Discharge   Problem: Pain Management: Goal: Pain level will decrease Outcome: Adequate for Discharge   Problem: Skin Integrity: Goal: Will show signs of wound healing Outcome: Adequate for Discharge   Problem: Health Behavior/Discharge Planning: Goal: Identification of resources available to assist in meeting health care needs will improve Outcome: Adequate for Discharge   

## 2021-03-31 ENCOUNTER — Other Ambulatory Visit: Payer: Self-pay | Admitting: Neurosurgery

## 2021-03-31 ENCOUNTER — Other Ambulatory Visit (HOSPITAL_COMMUNITY): Payer: Self-pay | Admitting: Neurosurgery

## 2021-03-31 DIAGNOSIS — G935 Compression of brain: Secondary | ICD-10-CM

## 2021-03-31 DIAGNOSIS — G950 Syringomyelia and syringobulbia: Secondary | ICD-10-CM

## 2021-05-01 ENCOUNTER — Ambulatory Visit: Payer: BC Managed Care – PPO

## 2023-01-01 ENCOUNTER — Telehealth: Payer: Self-pay | Admitting: Physician Assistant

## 2023-01-01 DIAGNOSIS — I10 Essential (primary) hypertension: Secondary | ICD-10-CM

## 2023-01-01 MED ORDER — LOSARTAN POTASSIUM 100 MG PO TABS: 100.0000 mg | ORAL_TABLET | Freq: Every day | ORAL | 0 refills | Status: AC

## 2023-01-01 NOTE — Patient Instructions (Signed)
Sean Oneill, thank you for joining Piedad Climes, PA-C for today's virtual visit.  While this provider is not your primary care provider (PCP), if your PCP is located in our provider database this encounter information will be shared with them immediately following your visit.   A [facility] MyChart account gives you access to today's visit and all your visits, tests, and labs performed at [facility] " click here if you don't have a [facility] MyChart account or go to [email or web address]  Consent: (Patient) Sean Oneill provided verbal consent for this virtual visit at the beginning of the encounter.  Current Medications:  Current Outpatient Medications:    losartan (COZAAR) 100 MG tablet, Take 100 mg by mouth every morning., Disp: , Rfl:    Medications ordered in this encounter:  No orders of the defined types were placed in this encounter.    *If you need refills on other medications prior to your next appointment, please contact your pharmacy*  Follow-Up: Call back or seek an in-person evaluation if the symptoms worsen or if the condition fails to improve as anticipated.  [facility] [phone]  Other Instructions DASH Eating Plan DASH stands for Dietary Approaches to Stop Hypertension. The DASH eating plan is a healthy eating plan that has been shown to: Lower high blood pressure (hypertension). Reduce your risk for type 2 diabetes, heart disease, and stroke. Help with weight loss. What are tips for following this plan? Reading food labels Check food labels for the amount of salt (sodium) per serving. Choose foods with less than 5 percent of the Daily Value (DV) of sodium. In general, foods with less than 300 milligrams (mg) of sodium per serving fit into this eating plan. To find whole grains, look for the word "whole" as the first word in the ingredient list. Shopping Buy products labeled as "low-sodium" or "no salt added." Buy  fresh foods. Avoid canned foods and pre-made or frozen meals. Cooking Try not to add salt when you cook. Use salt-free seasonings or herbs instead of table salt or sea salt. Check with your health care provider or pharmacist before using salt substitutes. Do not fry foods. Cook foods in healthy ways, such as baking, boiling, grilling, roasting, or broiling. Cook using oils that are good for your heart. These include olive, canola, avocado, soybean, and sunflower oil. Meal planning  Eat a balanced diet. This should include: 4 or more servings of fruits and 4 or more servings of vegetables each day. Try to fill half of your plate with fruits and vegetables. 6-8 servings of whole grains each day. 6 or less servings of lean meat, poultry, or fish each day. 1 oz is 1 serving. A 3 oz (85 g) serving of meat is about the same size as the palm of your hand. One egg is 1 oz (28 g). 2-3 servings of low-fat dairy each day. One serving is 1 cup (237 mL). 1 serving of nuts, seeds, or beans 5 times each week. 2-3 servings of heart-healthy fats. Healthy fats called omega-3 fatty acids are found in foods such as walnuts, flaxseeds, fortified milks, and eggs. These fats are also found in cold-water fish, such as sardines, salmon, and mackerel. Limit how much you eat of: Canned or prepackaged foods. Food that is high in trans fat, such as fried foods. Food that is high in saturated fat, such as fatty meat. Desserts and other sweets, sugary drinks, and other foods with added sugar. Full-fat dairy products.  Do not salt foods before eating. Do not eat more than 4 egg yolks a week. Try to eat at least 2 vegetarian meals a week. Eat more home-cooked food and less restaurant, buffet, and fast food. Lifestyle When eating at a restaurant, ask if your food can be made with less salt or no salt. If you drink alcohol: Limit how much you have to: 0-1 drink a day if you are male. 0-2 drinks a day if you are  male. Know how much alcohol is in your drink. In the [place] one drink is one 12 oz bottle of beer (355 mL), one 5 oz glass of wine (148 mL), or one 1 oz glass of hard liquor (44 mL). General information Avoid eating more than 2,300 mg of salt a day. If you have hypertension, you may need to reduce your sodium intake to 1,500 mg a day. Work with your provider to stay at a healthy body weight or lose weight. Ask what the best weight range is for you. On most days of the week, get at least 30 minutes of exercise that causes your heart to beat faster. This may include walking, swimming, or biking. Work with your provider or dietitian to adjust your eating plan to meet your specific calorie needs. What foods should I eat? Fruits All fresh, dried, or frozen fruit. Canned fruits that are in their natural juice and do not have sugar added to them. Vegetables Fresh or frozen vegetables that are raw, steamed, roasted, or grilled. Low-sodium or reduced-sodium tomato and vegetable juice. Low-sodium or reduced-sodium tomato sauce and tomato paste. Low-sodium or reduced-sodium canned vegetables. Grains Whole-grain or whole-wheat bread. Whole-grain or whole-wheat pasta. Brown rice. Orpah Cobb. Bulgur. Whole-grain and low-sodium cereals. Pita bread. Low-fat, low-sodium crackers. Whole-wheat flour tortillas. Meats and other proteins Skinless chicken or [place]. Ground chicken or [place]. Pork with fat trimmed off. Fish and seafood. Egg whites. Dried beans, peas, or lentils. Unsalted nuts, nut butters, and seeds. Unsalted canned beans. Lean cuts of beef with fat trimmed off. Low-sodium, lean precooked or cured meat, such as sausages or meat loaves. Dairy Low-fat (1%) or fat-free (skim) milk. Reduced-fat, low-fat, or fat-free cheeses. Nonfat, low-sodium ricotta or cottage cheese. Low-fat or nonfat yogurt. Low-fat, low-sodium cheese. Fats and oils Soft margarine without trans fats. Vegetable oil. Reduced-fat,  low-fat, or light mayonnaise and salad dressings (reduced-sodium). Canola, safflower, olive, avocado, soybean, and sunflower oils. Avocado. Seasonings and condiments Herbs. Spices. Seasoning mixes without salt. Other foods Unsalted popcorn and pretzels. Fat-free sweets. The items listed above may not be all the foods and drinks you can have. Talk to a dietitian to learn more. What foods should I avoid? Fruits Canned fruit in a light or heavy syrup. Fried fruit. Fruit in cream or butter sauce. Vegetables Creamed or fried vegetables. Vegetables in a cheese sauce. Regular canned vegetables that are not marked as low-sodium or reduced-sodium. Regular canned tomato sauce and paste that are not marked as low-sodium or reduced-sodium. Regular tomato and vegetable juices that are not marked as low-sodium or reduced-sodium. Rosita Fire. Olives. Grains Baked goods made with fat, such as croissants, muffins, or some breads. Dry pasta or rice meal packs. Meats and other proteins Fatty cuts of meat. Ribs. Fried meat. Tomasa Blase. Bologna, salami, and other precooked or cured meats, such as sausages or meat loaves, that are not lean and low in sodium. Fat from the back of a pig (fatback). Bratwurst. Salted nuts and seeds. Canned beans with added salt. Canned or smoked  fish. Whole eggs or egg yolks. Chicken or [place] with skin. Dairy Whole or 2% milk, cream, and half-and-half. Whole or full-fat cream cheese. Whole-fat or sweetened yogurt. Full-fat cheese. Nondairy creamers. Whipped toppings. Processed cheese and cheese spreads. Fats and oils Butter. Stick margarine. Lard. Shortening. Ghee. Bacon fat. Tropical oils, such as coconut, palm kernel, or palm oil. Seasonings and condiments Onion salt, garlic salt, seasoned salt, table salt, and sea salt. Worcestershire sauce. Tartar sauce. Barbecue sauce. Teriyaki sauce. Soy sauce, including reduced-sodium soy sauce. Steak sauce. Canned and packaged gravies. Fish sauce. Oyster  sauce. Cocktail sauce. Store-bought horseradish. Ketchup. Mustard. Meat flavorings and tenderizers. Bouillon cubes. Hot sauces. Pre-made or packaged marinades. Pre-made or packaged taco seasonings. Relishes. Regular salad dressings. Other foods Salted popcorn and pretzels. The items listed above may not be all the foods and drinks you should avoid. Talk to a dietitian to learn more. Where to find more information National Heart, Lung, and Blood Institute (NHLBI): BuffaloDryCleaner.gl American Heart Association (AHA): heart.org Academy of Nutrition and Dietetics: eatright.org National Kidney Foundation (NKF): kidney.org This information is not intended to replace advice given to you by your health care provider. Make sure you discuss any questions you have with your health care provider. Document Revised: 07/20/2022 Document Reviewed: 07/20/2022 Elsevier Patient Education  2024 Elsevier Inc.    If you have been instructed to have an in-person evaluation today at a local Urgent Care facility, please use the link below. It will take you to a list of all of our available [facility], including address, phone number and hours of operation. Please do not delay care.  [facility]  If you or a family member do not have a primary care provider, use the link below to schedule a visit and establish care. When you choose a [facility] primary care physician or advanced practice provider, you gain a long-term partner in health. Find a Primary Care Provider  Learn more about [facility]'s in-office and virtual care options: [facility] - Get Care Now

## 2023-01-01 NOTE — Progress Notes (Signed)
Virtual Visit Consent   Sean Oneill, you are scheduled for a virtual visit with a [facility] provider today. Just as with appointments in the office, your consent must be obtained to participate. Your consent will be active for this visit and any virtual visit you may have with one of our providers in the next 365 days. If you have a MyChart account, a copy of this consent can be sent to you electronically.  As this is a virtual visit, video technology does not allow for your provider to perform a traditional examination. This may limit your provider's ability to fully assess your condition. If your provider identifies any concerns that need to be evaluated in person or the need to arrange testing (such as labs, EKG, etc.), we will make arrangements to do so. Although advances in technology are sophisticated, we cannot ensure that it will always work on either your end or our end. If the connection with a video visit is poor, the visit may have to be switched to a telephone visit. With either a video or telephone visit, we are not always able to ensure that we have a secure connection.  By engaging in this virtual visit, you consent to the provision of healthcare and authorize for your insurance to be billed (if applicable) for the services provided during this visit. Depending on your insurance coverage, you may receive a charge related to this service.  I need to obtain your verbal consent now. Are you willing to proceed with your visit today? Rajesh Meuth has provided verbal consent on 01/01/2023 for a virtual visit (video or telephone). Sean Oneill, [place]  Date: 01/01/2023 7:43 PM  Virtual Visit via Video Note   I, Sean Oneill, connected with  Wallis Mart  ([id number], 02-06-1986) on 01/01/23 at  7:30 PM EDT by a video-enabled telemedicine application and verified that I am speaking with the correct person using two identifiers.  Location: Patient: Virtual Visit Location  Patient: Home Provider: Virtual Visit Location Provider: Home Office   I discussed the limitations of evaluation and management by telemedicine and the availability of in person appointments. The patient expressed understanding and agreed to proceed.    History of Present Illness: Sean Oneill is a 37 y.o. who identifies as a male who was assigned male at birth, and is being seen today for one-time medication refill. Patient notes currently on a regimen of losartan 100 mg daily. Taking daily as directed with previously good control of BP. Has a BP cuff at home but has not checked regularly in some time due to good control. Notes being out of his medication for one day. Is between PCP at present. Denies chest pain, SOB. Denies frequent headaches.    HPI: HPI  Problems:  Patient Active Problem List   Diagnosis Date Noted   Chiari I malformation (HCC) 11/29/2020    Allergies: No Known Allergies Medications:  Current Outpatient Medications:    losartan (COZAAR) 100 MG tablet, Take 1 tablet (100 mg total) by mouth daily., Disp: 90 tablet, Rfl: 0  Observations/Objective: Patient is well-developed, well-nourished in no acute distress.  Resting comfortably  at home.  Head is normocephalic, atraumatic.  No labored breathing.  Speech is clear and coherent with logical content.  Patient is alert and oriented at baseline.    Assessment and Plan: 1. Essential hypertension - losartan (COZAAR) 100 MG tablet; Take 1 tablet (100 mg total) by mouth daily.  Dispense: 90 tablet; Refill: 0  One time  refill given. Patient to follow DASH diet and check BP at least weekly to ensure staying in normotensive range. If climbing before can get in with new PCP, he is to be evaluated at nearest UC. He is aware no further refills can come from our practice until he has been evaluated in person.   Follow Up Instructions: I discussed the assessment and treatment plan with the patient. The patient was provided an  opportunity to ask questions and all were answered. The patient agreed with the plan and demonstrated an understanding of the instructions.  A copy of instructions were sent to the patient via MyChart unless otherwise noted below.   The patient was advised to call back or seek an in-person evaluation if the symptoms worsen or if the condition fails to improve as anticipated.  Time:  I spent 10 minutes with the patient via telehealth technology discussing the above problems/concerns.    Sean Climes, PA-C

## 2023-10-09 ENCOUNTER — Emergency Department
Admission: EM | Admit: 2023-10-09 | Discharge: 2023-10-09 | Disposition: A | Discharge status: Home / Self Care | Payer: Self-pay | Attending: Emergency Medicine | Admitting: Emergency Medicine

## 2023-10-09 ENCOUNTER — Encounter: Payer: Self-pay | Admitting: Emergency Medicine

## 2023-10-09 ENCOUNTER — Other Ambulatory Visit: Payer: Self-pay

## 2023-10-09 ENCOUNTER — Emergency Department: Payer: Self-pay

## 2023-10-09 DIAGNOSIS — Z79899 Other long term (current) drug therapy: Secondary | ICD-10-CM | POA: Insufficient documentation

## 2023-10-09 DIAGNOSIS — I10 Essential (primary) hypertension: Secondary | ICD-10-CM | POA: Insufficient documentation

## 2023-10-09 DIAGNOSIS — I4892 Unspecified atrial flutter: Secondary | ICD-10-CM | POA: Insufficient documentation

## 2023-10-09 LAB — BASIC METABOLIC PANEL
Anion gap: 10 (ref 5–15)
BUN: 27 mg/dL — ABNORMAL HIGH (ref 6–20)
CO2: 23 mmol/L (ref 22–32)
Calcium: 9.7 mg/dL (ref 8.9–10.3)
Chloride: 110 mmol/L (ref 98–111)
Creatinine, Ser: 1.74 mg/dL — ABNORMAL HIGH (ref 0.61–1.24)
GFR, Estimated: 51 mL/min — ABNORMAL LOW (ref >60)
Glucose, Bld: 93 mg/dL (ref 70–99)
Potassium: 3.5 mmol/L (ref 3.5–5.1)
Sodium: 143 mmol/L (ref 135–145)

## 2023-10-09 LAB — TROPONIN I (HIGH SENSITIVITY)
Troponin I (High Sensitivity): 25 ng/L — ABNORMAL HIGH (ref <18)
Troponin I (High Sensitivity): 29 ng/L — ABNORMAL HIGH (ref <18)

## 2023-10-09 LAB — CBC
HCT: 42.2 % (ref 39.0–52.0)
Hemoglobin: 13.5 g/dL (ref 13.0–17.0)
MCH: 23.6 pg — ABNORMAL LOW (ref 26.0–34.0)
MCHC: 32 g/dL (ref 30.0–36.0)
MCV: 73.8 fL — ABNORMAL LOW (ref 80.0–100.0)
Platelets: 477 10*3/uL — ABNORMAL HIGH (ref 150–400)
RBC: 5.72 MIL/uL (ref 4.22–5.81)
RDW: 16.3 % — ABNORMAL HIGH (ref 11.5–15.5)
WBC: 11.6 10*3/uL — ABNORMAL HIGH (ref 4.0–10.5)
nRBC: 0 % (ref 0.0–0.2)

## 2023-10-09 LAB — MAGNESIUM: Magnesium: 2.5 mg/dL — ABNORMAL HIGH (ref 1.7–2.4)

## 2023-10-09 LAB — PROTIME-INR
INR: 1 (ref 0.8–1.2)
Prothrombin Time: 13.8 s (ref 11.4–15.2)

## 2023-10-09 MED ORDER — LACTATED RINGERS IV BOLUS
1000.0000 mL | Freq: Once | INTRAVENOUS | Status: AC
  Administered 2023-10-09: 1000 mL via INTRAVENOUS

## 2023-10-09 MED ORDER — METOPROLOL TARTRATE 25 MG PO TABS
25.0000 mg | ORAL_TABLET | Freq: Once | ORAL | Status: AC
  Administered 2023-10-09: 25 mg via ORAL
  Filled 2023-10-09: qty 1

## 2023-10-09 MED ORDER — DILTIAZEM HCL 25 MG/5ML IV SOLN: 20.0000 mg | Freq: Once | INTRAVENOUS | Status: DC

## 2023-10-09 MED ORDER — METOPROLOL TARTRATE 25 MG PO TABS: 25.0000 mg | ORAL_TABLET | Freq: Two times a day (BID) | ORAL | 11 refills | Status: DC

## 2023-10-09 MED ORDER — DILTIAZEM HCL 25 MG/5ML IV SOLN
20.0000 mg | Freq: Once | INTRAVENOUS | Status: AC
  Administered 2023-10-09: 20 mg via INTRAVENOUS
  Filled 2023-10-09: qty 5

## 2023-10-09 NOTE — ED Notes (Signed)
 Alerted MD to

## 2023-10-09 NOTE — ED Triage Notes (Signed)
 Pt to ED via POV. Pt states that he has been having episodes where he feels like his heart is racing and he has chest discomfort. Pt denies cardiac history. Pt states that he did drink a pre-workout like drink this morning.

## 2023-10-09 NOTE — ED Provider Notes (Signed)
 [facility] Provider Note    Event Date/Time   First MD Initiated Contact with Patient 10/09/23 1114     (approximate)   History   Palpitations   HPI  Sean Oneill is a 38 y.o. male who presents to the ED for evaluation of Palpitations   I review a routine PCP visit from 4 days ago.  Obese patient with history of HTN on losartan and amlodipine.  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  Patient presents to the ED due to palpitations and a "weird feeling" in his chest.  Symptoms have been intermittent "for a while" but reports more prominent symptoms over the past couple days.  He does report using a preworkout powder supplement with "like 160 mg of caffeine" in it.  He reports developing palpitations and a weird feeling in his chest as he was starting his workout 3 presents to the ED for evaluation.  At the time he is brought back to the room and evaluate him, he reports feeling fine and has no symptoms.  Heart rate remains in the 160s.  No syncope, falls   Physical Exam   Triage Vital Signs: ED Triage Vitals [10/09/23 1107]  Encounter Vitals Group     BP (!) 130/90     Systolic BP Percentile      Diastolic BP Percentile      Pulse Rate (!) 167     Resp 18     Temp 97.9 F (36.6 C)     Temp src      SpO2 99 %     Weight 279 lb 15.8 oz (127 kg)     Height 5\' 8"  (1.727 m)     Head Circumference      Peak Flow      Pain Score 0     Pain Loc      Pain Education      Exclude from Growth Chart     Most recent vital signs: Vitals:   10/09/23 1315 10/09/23 1330  BP: (!) 143/116   Pulse: 75 (!) 149  Resp: 19   Temp:    SpO2: 97%     General: Awake, no distress.  Pleasant and conversational, making jokes. CV:  Good peripheral perfusion.  Tachycardic Resp:  Normal effort.  Abd:  No distention.  MSK:  No deformity noted.  Neuro:  No focal deficits  appreciated. Other:     ED Results / Procedures / Treatments   Labs (all labs ordered are listed, but only abnormal results are displayed) Labs Reviewed  BASIC METABOLIC PANEL - Abnormal; Notable for the following components:      Result Value   BUN 27 (*)    Creatinine, Ser 1.74 (*)    GFR, Estimated 51 (*)    All other components within normal limits  CBC - Abnormal; Notable for the following components:   WBC 11.6 (*)    MCV 73.8 (*)    MCH 23.6 (*)    RDW 16.3 (*)    Platelets 477 (*)    All other components within normal limits  MAGNESIUM - Abnormal; Notable for the following components:   Magnesium 2.5 (*)    All other components within normal limits  TROPONIN I (HIGH SENSITIVITY) - Abnormal; Notable for the following components:   Troponin I (High Sensitivity) 25 (*)    All other components within normal limits  TROPONIN I (HIGH SENSITIVITY) -  Abnormal; Notable for the following components:   Troponin I (High Sensitivity) 29 (*)    All other components within normal limits  PROTIME-INR    EKG SVT with a rate of 168 bpm.  Normal axis and intervals.  No STEMI.  Nonspecific changes are likely rate related  RADIOLOGY CXR interpreted by me without evidence of acute cardiopulmonary pathology.  Official radiology report(s): DG Chest Port 1 View Result Date: 10/09/2023 CLINICAL DATA:  Palpitations and chest discomfort. EXAM: PORTABLE CHEST 1 VIEW COMPARISON:  None Available. FINDINGS: Mild cardiomegaly. Normal pulmonary vascularity. No focal consolidation, pleural effusion, or pneumothorax. No acute osseous abnormality. IMPRESSION: 1. Mild cardiomegaly. No active disease. Electronically Signed   By: Obie Dredge M.D.   On: 10/09/2023 14:07    PROCEDURES and INTERVENTIONS:  .1-3 Lead EKG Interpretation  Performed by: Delton Prairie, MD Authorized by: Delton Prairie, MD     Interpretation: abnormal     ECG rate:  166   ECG rate assessment: tachycardic     Rhythm: SVT      Ectopy: none     Conduction: normal   .Critical Care  Performed by: Delton Prairie, MD Authorized by: Delton Prairie, MD   Critical care provider statement:    Critical care time (minutes):  75   Critical care time was exclusive of:  Separately billable procedures and treating other patients   Critical care was necessary to treat or prevent imminent or life-threatening deterioration of the following conditions:  Cardiac failure and circulatory failure   Critical care was time spent personally by me on the following activities:  Development of treatment plan with patient or surrogate, discussions with consultants, evaluation of patient's response to treatment, examination of patient, ordering and review of laboratory studies, ordering and review of radiographic studies, ordering and performing treatments and interventions, pulse oximetry, re-evaluation of patient's condition and review of old charts   Medications  metoprolol tartrate (LOPRESSOR) tablet 25 mg (has no administration in time range)  lactated ringers bolus 1,000 mL (0 mLs Intravenous Stopped 10/09/23 1252)  diltiazem (CARDIZEM) injection 20 mg (20 mg Intravenous Given 10/09/23 1207)  metoprolol tartrate (LOPRESSOR) tablet 25 mg (25 mg Oral Given 10/09/23 1250)     IMPRESSION / MDM / ASSESSMENT AND PLAN / ED COURSE  I reviewed the triage vital signs and the nursing notes.  Differential diagnosis includes, but is not limited to, ACS, PTX, PNA, muscle strain/spasm, PE, dissection, anxiety, pleural effusion  {Patient presents with symptoms of an acute illness or injury that is potentially life-threatening.  Patient presents to the ED for evaluation of palpitations, ultimately with evidence of new onset atrial flutter should be suitable for outpatient management cardiology follow-up.  Initial presentation and EKG were concerning for SVT but after trialing multiple interventions with medications and vagal maneuvers I ultimately see  variable conduction atrial flutter.  His heart rates and rhythm are quite labile, bouncing back-and-forth between atrial flutter and sinus rhythms.  He remained stable without any signs of low blood pressure or shock.  CXR and serum workup are quite benign with normal electrolytes.  Minimal elevation of troponin is likely rate related and I doubt ACS.  No ischemic features.  Renal function around baseline with depressed GFR at 51.  As below his heart rates are still quite labile he remains minimally symptomatic.  Starting on oral metoprolol tartrate hopefully discharged with the same with cardiology follow-up.  Clinical Course as of 10/09/23 1500  Tue Oct 09, 2023  1143 Reassessed  before modified vagal maneuvers.  He reports still feeling fine but some mild palpitations.  He provides good effort and performs a modified vagal maneuver that is transiently successful converting to rates in the 90s, on telemetry it would look like an atrial flutter with 3-1 block.  Appears to walk out of the room to grab some staff to print out another twelve-lead EKG for me but as we are working on getting this printed he converts back to rapid rates 140s-150s.  This repeat twelve-lead looks more like atrial flutter 2-1 block at this point, more so than his first EKG.  We will trial an IV bolus of diltiazem [DS]  1153 I was communicating with the nurse to provide this diltiazem but I am informed that he is switch back to a normal sinus rhythm.  I immediately go reevaluate and he is in a NSR, rate in the 80s and reports feeling fine.  Reports that he did the vagal maneuvers again after I left and they were successful. [DS]  1153 Repeat EKG with normal sinus rhythm with rates in the 80s.  No ischemic features.  No signs of atrial flutter. [DS]  1202 Rapid rates unfortunately recurred to the 160s.  I reviewed telemetry data.  Updated nurse, we will try diltiazem [DS]  1219 There is rhythm changes after diltiazem, looks like  atrial flutter, slowing down eventually converts back to a normal sinus rhythm.  I page cardiology. [DS]  1220 I calculated a CHA2DS2-VASc score of only 1 due to his history of hypertension. [DS]  1235 I consult with Dr.  Kirke Corin, he recommends. Metop 25mg  BID [DS]  1303 Reassessed.  Looks well, asymptomatic.  Bouncing in and outs of rapid flutter and NSR. [DS]  1401 Reassessed, mother and spouse at the bedside.  Discussed workup, plan of care.  Hopefully outpatient management.  Continues to have labile heart rates [DS]    Clinical Course User Index [DS] Delton Prairie, MD     FINAL CLINICAL IMPRESSION(S) / ED DIAGNOSES   Final diagnoses:  Atrial flutter with rapid ventricular response (HCC)     Rx / DC Orders   ED Discharge Orders          Ordered    metoprolol tartrate (LOPRESSOR) 25 MG tablet  2 times daily        10/09/23 1451    Ambulatory referral to Cardiology       Comments: If you have not heard from the Cardiology office within the next 72 hours please call [phone].   10/09/23 1452             Note:  This document was prepared using Dragon voice recognition software and may include unintentional dictation errors.   Delton Prairie, MD 10/09/23 1500

## 2023-10-09 NOTE — ED Notes (Signed)
 EDP at bedside

## 2023-10-09 NOTE — Discharge Instructions (Addendum)
 You should be getting a phone call from the cardiology clinic to schedule an appointment in the next couple weeks.  Start taking metoprolol twice daily every day.  This helps slow your heart rate and hopefully prevent episodes of abnormal rhythm, atrial flutter. You can take an extra (third dose) once per day, only as needed, if you feel like you are having many episodes of palpitations and rapid heart rates.  Continue to take your losartan for blood pressure but I would stop the other medication stop amlodipine/Norvasc.  If you develop any episodes of passing out, pain or palpitations that will not go away despite the medication then please return to the ED.

## 2023-10-12 ENCOUNTER — Telehealth: Payer: Self-pay | Admitting: Cardiology

## 2023-10-12 NOTE — Telephone Encounter (Signed)
 Patient c/o Palpitations:  STAT if patient reporting lightheadedness, shortness of breath, or chest pain  How long have you had palpitations/irregular HR/ Afib? Are you having the symptoms now? Last night; yes   Are you currently experiencing lightheadedness, SOB or CP? Chest pain; SOB last night  Do you have a history of afib (atrial fibrillation) or irregular heart rhythm? Yes   Have you checked your BP or HR? (document readings if available): 107 HR yesterday  Are you experiencing any other symptoms? No

## 2023-10-12 NOTE — Telephone Encounter (Signed)
 Wife Gloris Manchester) wants a call back to discuss cost and payment options for patient's visit scheduled on 4/2.

## 2023-10-12 NOTE — Telephone Encounter (Signed)
 STAT phone call, pt's wife Gloris Manchester) reports that pt needs to be seen sooner than appt in July - concerned for pt's well being  Reports that pt was seen past week in ED HR of 177 and DX: SVT, meds changed and pt is now on metoprolol 25 mg BID and losartan unknown mg daily  For the last 2 days pt c/o of "chest fluttering", HA, "no energy" and attempting to consume "energy drinks" (V-* with caffeine) to alleviate the energy problem, BP's at home are "150s over 120s", doesn't have HR at this time  --- Pt called back and d/t severity of conditions given sooner DOD appt with Dr Azucena Cecil, pt also advised to seek sooner appt with [facility] since other care has been there in the past  Wife given ED precautions for pt

## 2023-10-17 ENCOUNTER — Ambulatory Visit: Payer: Self-pay

## 2023-10-17 ENCOUNTER — Ambulatory Visit: Payer: Self-pay | Attending: Cardiology | Admitting: Cardiology

## 2023-10-17 ENCOUNTER — Encounter: Payer: Self-pay | Admitting: Cardiology

## 2023-10-17 VITALS — BP 150/82 | HR 59 | Ht 68.0 in | Wt 288.4 lb

## 2023-10-17 DIAGNOSIS — I4892 Unspecified atrial flutter: Secondary | ICD-10-CM

## 2023-10-17 DIAGNOSIS — I10 Essential (primary) hypertension: Secondary | ICD-10-CM

## 2023-10-17 MED ORDER — ASPIRIN 81 MG PO TBEC: 81.0000 mg | DELAYED_RELEASE_TABLET | Freq: Every day | ORAL | Status: AC

## 2023-10-17 MED ORDER — METOPROLOL TARTRATE 25 MG PO TABS: 25.0000 mg | ORAL_TABLET | Freq: Two times a day (BID) | ORAL | 3 refills | Status: AC

## 2023-10-17 MED ORDER — AMLODIPINE BESYLATE 10 MG PO TABS: 10.0000 mg | ORAL_TABLET | Freq: Every day | ORAL | 3 refills | Status: AC

## 2023-10-17 NOTE — Patient Instructions (Signed)
 Medication Instructions:   Please start Aspirin 81 MG daily   Please start Amlodipine 10 MG daily  *If you need a refill on your cardiac medications before your next appointment, please call your pharmacy*  Lab Work: None ordered  If you have labs (blood work) drawn today and your tests are completely normal, you will receive your results only by: MyChart Message (if you have MyChart) OR A paper copy in the mail If you have any lab test that is abnormal or we need to change your treatment, we will call you to review the results.  Testing/Procedures: Your physician has requested that you have an echocardiogram. Echocardiography is a painless test that uses sound waves to create images of your heart. It provides your doctor with information about the size and shape of your heart and how well your heart's chambers and valves are working.   You may receive an ultrasound enhancing agent through an IV if needed to better visualize your heart during the echo. This procedure takes approximately one hour.  There are no restrictions for this procedure.  This will take place at [address] [facility] [address], [place] [address]  Please note: We ask at that you not bring children with you during ultrasound (echo/ vascular) testing. Due to room size and safety concerns, children are not allowed in the ultrasound rooms during exams. Our front office staff cannot provide observation of children in our lobby area while testing is being conducted. An adult accompanying a patient to their appointment will only be allowed in the ultrasound room at the discretion of the ultrasound technician under special circumstances. We apologize for any inconvenience.   ZIO XT- Long Term Monitor Instructions  Your physician has requested you wear a ZIO patch monitor for 14 days.  This is a single patch monitor. [employer] supplies one patch monitor per enrollment. Additional stickers are not available.  Please do not apply patch if you will be having a Nuclear Stress Test,  Echocardiogram, Cardiac CT, MRI, or Chest Xray during the period you would be wearing the  monitor. The patch cannot be worn during these tests. You cannot remove and re-apply the  ZIO XT patch monitor.  Your ZIO patch monitor will be mailed 3 day USPS to your address on file. It may take 3-5 days  to receive your monitor after you have been enrolled.  Once you have received your monitor, please review the enclosed instructions. Your monitor  has already been registered assigning a specific monitor serial # to you.  Billing and Patient Assistance Program Information  We have supplied [employer] with any of your insurance information on file for billing purposes. [employer] offers a sliding scale Patient Assistance Program for patients that do not have  insurance, or whose insurance does not completely cover the cost of the ZIO monitor.  You must apply for the Patient Assistance Program to qualify for this discounted rate.  To apply, please call [employer] at [phone], select option 4, select option 2, ask to apply for  Patient Assistance Program. Meredeth Ide will ask your household income, and how many people  are in your household. They will quote your out-of-pocket cost based on that information.  [employer] will also be able to set up a 34-month, interest-free payment plan if needed.  Applying the monitor   Shave hair from upper left chest.  Hold abrader disc by orange tab. Rub abrader in 40 strokes over the upper left chest as  indicated in  your monitor instructions.  Clean area with 4 enclosed alcohol pads. Let dry.  Apply patch as indicated in monitor instructions. Patch will be placed under collarbone on left  side of chest with arrow pointing upward.  Rub patch adhesive wings for 2 minutes. Remove white label marked "1". Remove the white  label marked "2". Rub patch adhesive wings for 2 additional minutes.  While  looking in a mirror, press and release button in center of patch. A small green light will  flash 3-4 times. This will be your only indicator that the monitor has been turned on.  Do not shower for the first 24 hours. You may shower after the first 24 hours.  Press the button if you feel a symptom. You will hear a small click. Record Date, Time and  Symptom in the Patient Logbook.  When you are ready to remove the patch, follow instructions on the last 2 pages of Patient  Logbook. Stick patch monitor onto the last page of Patient Logbook.  Place Patient Logbook in the blue and white box. Use locking tab on box and tape box closed  securely. The blue and white box has prepaid postage on it. Please place it in the mailbox as  soon as possible. Your physician should have your test results approximately 7 days after the  monitor has been mailed back to [facility].  Call [facility] Customer Care at [phone] if you have questions regarding  your ZIO XT patch monitor. Call them immediately if you see an orange light blinking on your  monitor.  If your monitor falls off in less than 4 days, contact our Monitor department at [phone].  If your monitor becomes loose or falls off after 4 days call [employer] at [phone] for  suggestions on securing your monitor   Follow-Up: At [facility], you and your health needs are our priority.  As part of our continuing mission to provide you with exceptional heart care, our providers are all part of one team.  This team includes your primary Cardiologist (physician) and Advanced Practice Providers or APPs (Physician Assistants and Nurse Practitioners) who all work together to provide you with the care you need, when you need it.  Your next appointment:   2 month(s)  Provider:   You may see Dr. Azucena Cecil or one of the following Advanced Practice Providers on your designated Care Team:   Nicolasa Ducking, NP Ames Dura,  PA-C Eula Listen, PA-C Cadence [place], PA-C Charlsie Quest, NP Carlos Levering, NP    We recommend signing up for the patient portal called "MyChart".  Sign up information is provided on this After Visit Summary.  MyChart is used to connect with patients for Virtual Visits (Telemedicine).  Patients are able to view lab/test results, encounter notes, upcoming appointments, etc.  Non-urgent messages can be sent to your provider as well.   To learn more about what you can do with MyChart, go to [email or web address].

## 2023-10-17 NOTE — Progress Notes (Signed)
 Cardiology Office Note:    Date:  10/17/2023   ID:  Sean Oneill, DOB 1986/02/12, MRN [id number]  PCP:  Gracelyn Nurse, MD   [facility] Providers Cardiologist:  None     Referring MD: 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  Presented to the ED a week ago with symptoms of chest pain.  Reported using a preworkout powder supplement with 160 mg of caffeine in it.  EKG in the ED showed SVT with heart rate 168.  Vagal maneuvers were performed in the ED, underlying atrial flutter was noted.  Patient was given Cardizem with conversion of atrial flutter to sinus rhythm.  He was subsequently started on Lopressor 25 mg twice daily upon discharge from the ED.  Denies having any prior symptoms, has not had any symptoms since.  Past Medical History:  Diagnosis Date   Chiari malformation type I (HCC)    GERD (gastroesophageal reflux disease)    OCC NO MEDS   History of kidney stones    H/O   Hypertension     Past Surgical History:  Procedure Laterality Date   CRANIECTOMY N/A 11/29/2020   Procedure: SUBOCCIPITAL CRANIECTOMY, C1 LAMINECTOMY, DURAPLASTY;  Surgeon: Lucy Chris, MD;  Location: [facility] ORS;  Service: Neurosurgery;  Laterality: N/A;   NO PAST SURGERIES      Current Medications: Current Meds  Medication Sig   amLODipine (NORVASC) 10 MG tablet Take 1 tablet (10 mg total) by mouth daily.   aspirin EC 81 MG tablet Take 1 tablet (81 mg total) by mouth daily. Swallow whole.   losartan (COZAAR) 100 MG tablet Take 1 tablet (100 mg total) by  mouth daily.   [DISCONTINUED] metoprolol tartrate (LOPRESSOR) 25 MG tablet Take 1 tablet (25 mg total) by mouth 2 (two) times daily.     Allergies:   Patient has no known allergies.   Social History   Socioeconomic History   Marital status: Single    Spouse name: Not on file   Number of children: Not on file   Years of education: Not on file   Highest education level: Not on file  Occupational History   Not on file  Tobacco Use   Smoking status: Never   Smokeless tobacco: Never  Vaping Use   Vaping status: Never Used  Substance and Sexual Activity   Alcohol use: Yes    Comment: OCC   Drug use: Yes    Types: Marijuana    Comment: OCC   Sexual activity: Not on file  Other Topics Concern   Not on file  Social History Narrative   Not on file   Social Drivers of Health   Financial Resource Strain: Low Risk  (04/20/2023)   Received from [facility]   Overall Financial Resource Strain (CARDIA)    Difficulty of Paying Living Expenses: Not hard at all  Food Insecurity: No Food Insecurity (04/20/2023)   Received from [facility]   Hunger Vital Sign    Worried About Running Out of Food  in the Last Year: Never true    Ran Out of Food in the Last Year: Never true  Transportation Needs: No Transportation Needs (04/20/2023)   Received from [facility] - Transportation    In the past 12 months, has lack of transportation kept you from medical appointments or from getting medications?: No    Lack of Transportation (Non-Medical): No  Physical Activity: Not on file  Stress: Not on file  Social Connections: Not on file     Family History: The patient's family history is not on file.  ROS:   Please see the history of present illness.     All other systems reviewed and are negative.  EKGs/Labs/Other Studies Reviewed:    The following studies were reviewed today:  EKG Interpretation Date/Time:  Wednesday October 17 2023 10:34:16 EDT Ventricular Rate:  59 PR Interval:  180 QRS Duration:  84 QT Interval:  418 QTC Calculation: 413 R Axis:   51  Text Interpretation: Sinus bradycardia Confirmed by Debbe Odea ([id number]) on 10/17/2023 10:36:23 AM    Recent Labs: 10/09/2023: BUN 27; Creatinine, Ser 1.74; Hemoglobin 13.5; Magnesium 2.5; Platelets 477; Potassium 3.5; Sodium 143  Recent Lipid Panel No results found for: "CHOL", "TRIG", "HDL", "CHOLHDL", "VLDL", "LDLCALC", "LDLDIRECT"   Risk Assessment/Calculations:          Physical Exam:    VS:  BP (!) 150/82 (BP Location: Left Arm, Patient Position: Sitting)   Pulse (!) 59   Ht 5\' 8"  (1.727 m)   Wt 288 lb 6.4 oz (130.8 kg)   SpO2 99%   BMI 43.85 kg/m     Wt Readings from Last 3 Encounters:  10/17/23 288 lb 6.4 oz (130.8 kg)  10/09/23 279 lb 15.8 oz (127 kg)  11/29/20 280 lb (127 kg)     GEN:  Well nourished, well developed in no acute distress HEENT: Normal NECK: No JVD; No carotid bruits CARDIAC: RRR, no murmurs, rubs, gallops RESPIRATORY:  Clear to auscultation without rales, wheezing or rhonchi  ABDOMEN: Soft, non-tender, non-distended MUSCULOSKELETAL:  No edema; No deformity  SKIN: Warm and dry NEUROLOGIC:  Alert and oriented x 3 PSYCHIATRIC:  Normal affect   ASSESSMENT:    1. Atrial flutter, unspecified type (HCC)   2. Primary hypertension   3. Morbid obesity (HCC)    PLAN:    In order of problems listed above:  Atrial flutter, unsure if this is a lone event, instigated by taking caffeinated preworkout powder.  Patient has not had any events since.  Place cardiac monitor to evaluate any recurrence.  Continue Lopressor 25 mg twice daily, CHA2DS2-VASc of 1.  Start aspirin 81 mg daily. Hypertension, BP elevated.  Start Norvasc 10 mg daily, continue losartan 100 mg daily, Lopressor 25 mg twice daily. Morbid obesity, low-calorie diet, low-salt diet advised.  Follow-up after echo and cardiac monitor     Medication  Adjustments/Labs and Tests Ordered: Current medicines are reviewed at length with the patient today.  Concerns regarding medicines are outlined above.  Orders Placed This Encounter  Procedures   LONG TERM MONITOR (3-14 DAYS)   EKG 12-Lead   ECHOCARDIOGRAM COMPLETE   Meds ordered this encounter  Medications   metoprolol tartrate (LOPRESSOR) 25 MG tablet    Sig: Take 1 tablet (25 mg total) by mouth 2 (two) times daily.    Dispense:  180 tablet    Refill:  3   aspirin EC 81 MG tablet    Sig:  Take 1 tablet (81 mg total) by mouth daily. Swallow whole.   amLODipine (NORVASC) 10 MG tablet    Sig: Take 1 tablet (10 mg total) by mouth daily.    Dispense:  180 tablet    Refill:  3    Patient Instructions  Medication Instructions:   Please start Aspirin 81 MG daily   Please start Amlodipine 10 MG daily  *If you need a refill on your cardiac medications before your next appointment, please call your pharmacy*  Lab Work: None ordered  If you have labs (blood work) drawn today and your tests are completely normal, you will receive your results only by: MyChart Message (if you have MyChart) OR A paper copy in the mail If you have any lab test that is abnormal or we need to change your treatment, we will call you to review the results.  Testing/Procedures: Your physician has requested that you have an echocardiogram. Echocardiography is a painless test that uses sound waves to create images of your heart. It provides your doctor with information about the size and shape of your heart and how well your heart's chambers and valves are working.   You may receive an ultrasound enhancing agent through an IV if needed to better visualize your heart during the echo. This procedure takes approximately one hour.  There are no restrictions for this procedure.  This will take place at [address] [facility] [address] [facility], [place] [address]  Please note: We ask at that you not bring  children with you during ultrasound (echo/ vascular) testing. Due to room size and safety concerns, children are not allowed in the ultrasound rooms during exams. Our front office staff cannot provide observation of children in our lobby area while testing is being conducted. An adult accompanying a patient to their appointment will only be allowed in the ultrasound room at the discretion of the ultrasound technician under special circumstances. We apologize for any inconvenience.   ZIO XT- Long Term Monitor Instructions  Your physician has requested you wear a ZIO patch monitor for 14 days.  This is a single patch monitor. [employer] supplies one patch monitor per enrollment. Additional stickers are not available. Please do not apply patch if you will be having a Nuclear Stress Test,  Echocardiogram, Cardiac CT, MRI, or Chest Xray during the period you would be wearing the  monitor. The patch cannot be worn during these tests. You cannot remove and re-apply the  ZIO XT patch monitor.  Your ZIO patch monitor will be mailed 3 day USPS to your address on file. It may take 3-5 days  to receive your monitor after you have been enrolled.  Once you have received your monitor, please review the enclosed instructions. Your monitor  has already been registered assigning a specific monitor serial # to you.  Billing and Patient Assistance Program Information  We have supplied [employer] with any of your insurance information on file for billing purposes. [employer] offers a sliding scale Patient Assistance Program for patients that do not have  insurance, or whose insurance does not completely cover the cost of the ZIO monitor.  You must apply for the Patient Assistance Program to qualify for this discounted rate.  To apply, please call [employer] at [phone], select option 4, select option 2, ask to apply for  Patient Assistance Program. Meredeth Ide will ask your household income, and how many people  are in your  household. They will quote your out-of-pocket cost based on  that information.  [employer] will also be able to set up a 69-month, interest-free payment plan if needed.  Applying the monitor   Shave hair from upper left chest.  Hold abrader disc by orange tab. Rub abrader in 40 strokes over the upper left chest as  indicated in your monitor instructions.  Clean area with 4 enclosed alcohol pads. Let dry.  Apply patch as indicated in monitor instructions. Patch will be placed under collarbone on left  side of chest with arrow pointing upward.  Rub patch adhesive wings for 2 minutes. Remove white label marked "1". Remove the white  label marked "2". Rub patch adhesive wings for 2 additional minutes.  While looking in a mirror, press and release button in center of patch. A small green light will  flash 3-4 times. This will be your only indicator that the monitor has been turned on.  Do not shower for the first 24 hours. You may shower after the first 24 hours.  Press the button if you feel a symptom. You will hear a small click. Record Date, Time and  Symptom in the Patient Logbook.  When you are ready to remove the patch, follow instructions on the last 2 pages of Patient  Logbook. Stick patch monitor onto the last page of Patient Logbook.  Place Patient Logbook in the blue and white box. Use locking tab on box and tape box closed  securely. The blue and white box has prepaid postage on it. Please place it in the mailbox as  soon as possible. Your physician should have your test results approximately 7 days after the  monitor has been mailed back to [facility].  Call [facility] Customer Care at [phone] if you have questions regarding  your ZIO XT patch monitor. Call them immediately if you see an orange light blinking on your  monitor.  If your monitor falls off in less than 4 days, contact our Monitor department at [phone].  If your monitor becomes loose or falls off after  4 days call [employer] at [phone] for  suggestions on securing your monitor   Follow-Up: At [facility], you and your health needs are our priority.  As part of our continuing mission to provide you with exceptional heart care, our providers are all part of one team.  This team includes your primary Cardiologist (physician) and Advanced Practice Providers or APPs (Physician Assistants and Nurse Practitioners) who all work together to provide you with the care you need, when you need it.  Your next appointment:   2 month(s)  Provider:   You may see Dr. Azucena Cecil or one of the following Advanced Practice Providers on your designated Care Team:   Nicolasa Ducking, NP Ames Dura, PA-C Eula Listen, PA-C Cadence Parker, PA-C Charlsie Quest, NP Carlos Levering, NP    We recommend signing up for the patient portal called "MyChart".  Sign up information is provided on this After Visit Summary.  MyChart is used to connect with patients for Virtual Visits (Telemedicine).  Patients are able to view lab/test results, encounter notes, upcoming appointments, etc.  Non-urgent messages can be sent to your provider as well.   To learn more about what you can do with MyChart, go to [email or web address].            Signed, Debbe Odea, MD  10/17/2023 11:34 AM    [facility]

## 2023-11-14 ENCOUNTER — Ambulatory Visit: Payer: Self-pay | Attending: Cardiology

## 2023-11-14 DIAGNOSIS — I4892 Unspecified atrial flutter: Secondary | ICD-10-CM

## 2023-11-14 LAB — ECHOCARDIOGRAM COMPLETE
AR max vel: 3.37 cm2
AV Area VTI: 3.19 cm2
AV Area mean vel: 3.06 cm2
AV Mean grad: 5 mmHg
AV Peak grad: 8.3 mmHg
Ao pk vel: 1.44 m/s
Area-P 1/2: 3.27 cm2
Calc EF: 50.5 %
S' Lateral: 2.9 cm
Single Plane A2C EF: 51.8 %
Single Plane A4C EF: 45.9 %

## 2023-12-17 ENCOUNTER — Ambulatory Visit: Payer: Self-pay | Attending: Cardiology | Admitting: Cardiology

## 2024-01-15 ENCOUNTER — Ambulatory Visit: Payer: Self-pay | Admitting: Cardiology
